# Patient Record
Sex: Male | Born: 1955 | Race: White | Hispanic: No | Marital: Married | Smoking: Never smoker
Health system: Southern US, Community
[De-identification: ages and names within clinical notes are randomized; demographics above are authoritative.]

## PROBLEM LIST (undated history)

## (undated) DIAGNOSIS — I10 Essential (primary) hypertension: Secondary | ICD-10-CM

## (undated) DIAGNOSIS — E119 Type 2 diabetes mellitus without complications: Secondary | ICD-10-CM

## (undated) HISTORY — PX: OTHER SURGICAL HISTORY: SHX169

## (undated) HISTORY — PX: HERNIA REPAIR: SHX51

---

## 2013-06-18 ENCOUNTER — Encounter (HOSPITAL_BASED_OUTPATIENT_CLINIC_OR_DEPARTMENT_OTHER): Payer: Self-pay | Admitting: Emergency Medicine

## 2013-06-18 ENCOUNTER — Emergency Department (HOSPITAL_BASED_OUTPATIENT_CLINIC_OR_DEPARTMENT_OTHER)
Admission: EM | Admit: 2013-06-18 | Discharge: 2013-06-18 | Disposition: A | Discharge status: Home / Self Care | Payer: BC Managed Care – PPO | Attending: Emergency Medicine | Admitting: Emergency Medicine

## 2013-06-18 ENCOUNTER — Emergency Department (HOSPITAL_BASED_OUTPATIENT_CLINIC_OR_DEPARTMENT_OTHER): Payer: BC Managed Care – PPO

## 2013-06-18 DIAGNOSIS — W010XXA Fall on same level from slipping, tripping and stumbling without subsequent striking against object, initial encounter: Secondary | ICD-10-CM | POA: Insufficient documentation

## 2013-06-18 DIAGNOSIS — Z79899 Other long term (current) drug therapy: Secondary | ICD-10-CM | POA: Insufficient documentation

## 2013-06-18 DIAGNOSIS — W1809XA Striking against other object with subsequent fall, initial encounter: Secondary | ICD-10-CM | POA: Insufficient documentation

## 2013-06-18 DIAGNOSIS — Y9319 Activity, other involving water and watercraft: Secondary | ICD-10-CM | POA: Insufficient documentation

## 2013-06-18 DIAGNOSIS — E119 Type 2 diabetes mellitus without complications: Secondary | ICD-10-CM | POA: Insufficient documentation

## 2013-06-18 DIAGNOSIS — Y9289 Other specified places as the place of occurrence of the external cause: Secondary | ICD-10-CM | POA: Insufficient documentation

## 2013-06-18 DIAGNOSIS — X500XXA Overexertion from strenuous movement or load, initial encounter: Secondary | ICD-10-CM | POA: Insufficient documentation

## 2013-06-18 DIAGNOSIS — S93401A Sprain of unspecified ligament of right ankle, initial encounter: Secondary | ICD-10-CM

## 2013-06-18 DIAGNOSIS — I10 Essential (primary) hypertension: Secondary | ICD-10-CM | POA: Insufficient documentation

## 2013-06-18 DIAGNOSIS — S93409A Sprain of unspecified ligament of unspecified ankle, initial encounter: Secondary | ICD-10-CM | POA: Insufficient documentation

## 2013-06-18 DIAGNOSIS — L03119 Cellulitis of unspecified part of limb: Secondary | ICD-10-CM

## 2013-06-18 DIAGNOSIS — Z9889 Other specified postprocedural states: Secondary | ICD-10-CM | POA: Insufficient documentation

## 2013-06-18 HISTORY — DX: Type 2 diabetes mellitus without complications: E11.9

## 2013-06-18 HISTORY — DX: Essential (primary) hypertension: I10

## 2013-06-18 LAB — CBC WITH DIFFERENTIAL/PLATELET
BASOS PCT: 0 % (ref 0–1)
Basophils Absolute: 0 10*3/uL (ref 0.0–0.1)
EOS ABS: 0.4 10*3/uL (ref 0.0–0.7)
EOS PCT: 3 % (ref 0–5)
HEMATOCRIT: 41.7 % (ref 39.0–52.0)
HEMOGLOBIN: 14.4 g/dL (ref 13.0–17.0)
Lymphocytes Relative: 16 % (ref 12–46)
Lymphs Abs: 2.3 10*3/uL (ref 0.7–4.0)
MCH: 30.2 pg (ref 26.0–34.0)
MCHC: 34.5 g/dL (ref 30.0–36.0)
MCV: 87.4 fL (ref 78.0–100.0)
MONO ABS: 1.6 10*3/uL — AB (ref 0.1–1.0)
Monocytes Relative: 11 % (ref 3–12)
Neutro Abs: 9.8 10*3/uL — ABNORMAL HIGH (ref 1.7–7.7)
Neutrophils Relative %: 70 % (ref 43–77)
Platelets: 271 10*3/uL (ref 150–400)
RBC: 4.77 MIL/uL (ref 4.22–5.81)
RDW: 13.4 % (ref 11.5–15.5)
WBC: 14.1 10*3/uL — ABNORMAL HIGH (ref 4.0–10.5)

## 2013-06-18 LAB — CBG MONITORING, ED: Glucose-Capillary: 306 mg/dL — ABNORMAL HIGH (ref 70–99)

## 2013-06-18 MED ORDER — CEPHALEXIN 500 MG PO CAPS: 500.0000 mg | ORAL_CAPSULE | Freq: Four times a day (QID) | ORAL | Status: DC

## 2013-06-18 NOTE — Discharge Instructions (Signed)
Ankle Sprain °An ankle sprain is an injury to the strong, fibrous tissues (ligaments) that hold the bones of your ankle joint together.  °CAUSES °An ankle sprain is usually caused by a fall or by twisting your ankle. Ankle sprains most commonly occur when you step on the outer edge of your foot, and your ankle turns inward. People who participate in sports are more prone to these types of injuries.  °SYMPTOMS  °· Pain in your ankle. The pain may be present at rest or only when you are trying to stand or walk. °· Swelling. °· Bruising. Bruising may develop immediately or within 1 to 2 days after your injury. °· Difficulty standing or walking, particularly when turning corners or changing directions. °DIAGNOSIS  °Your caregiver will ask you details about your injury and perform a physical exam of your ankle to determine if you have an ankle sprain. During the physical exam, your caregiver will press on and apply pressure to specific areas of your foot and ankle. Your caregiver will try to move your ankle in certain ways. An X-ray exam may be done to be sure a bone was not broken or a ligament did not separate from one of the bones in your ankle (avulsion fracture).  °TREATMENT  °Certain types of braces can help stabilize your ankle. Your caregiver can make a recommendation for this. Your caregiver may recommend the use of medicine for pain. If your sprain is severe, your caregiver may refer you to a surgeon who helps to restore function to parts of your skeletal system (orthopedist) or a physical therapist. °HOME CARE INSTRUCTIONS  °· Apply ice to your injury for 1 2 days or as directed by your caregiver. Applying ice helps to reduce inflammation and pain. °· Put ice in a plastic bag. °· Place a towel between your skin and the bag. °· Leave the ice on for 15-20 minutes at a time, every 2 hours while you are awake. °· Only take over-the-counter or prescription medicines for pain, discomfort, or fever as directed by  your caregiver. °· Elevate your injured ankle above the level of your heart as much as possible for 2 3 days. °· If your caregiver recommends crutches, use them as instructed. Gradually put weight on the affected ankle. Continue to use crutches or a cane until you can walk without feeling pain in your ankle. °· If you have a plaster splint, wear the splint as directed by your caregiver. Do not rest it on anything harder than a pillow for the first 24 hours. Do not put weight on it. Do not get it wet. You may take it off to take a shower or bath. °· You may have been given an elastic bandage to wear around your ankle to provide support. If the elastic bandage is too tight (you have numbness or tingling in your foot or your foot becomes cold and blue), adjust the bandage to make it comfortable. °· If you have an air splint, you may blow more air into it or let air out to make it more comfortable. You may take your splint off at night and before taking a shower or bath. Wiggle your toes in the splint several times per day to decrease swelling. °SEEK MEDICAL CARE IF:  °· You have rapidly increasing bruising or swelling. °· Your toes feel extremely cold or you lose feeling in your foot. °· Your pain is not relieved with medicine. °SEEK IMMEDIATE MEDICAL CARE IF: °· Your toes are numb   or blue.  You have severe pain that is increasing. MAKE SURE YOU:   Understand these instructions.  Will watch your condition.  Will get help right away if you are not doing well or get worse. Document Released: 04/01/2005 Document Revised: 12/25/2011 Document Reviewed: 04/13/2011 [facility] Patient Information 2014 [place]Care, [place].  Cellulitis Cellulitis is an infection of the skin and the tissue beneath it. The infected area is usually red and tender. Cellulitis occurs most often in the arms and lower legs.  CAUSES  Cellulitis is caused by bacteria that enter the skin through cracks or cuts in the skin. The most common types  of bacteria that cause cellulitis are Staphylococcus and Streptococcus. SYMPTOMS   Redness and warmth.  Swelling.  Tenderness or pain.  Fever. DIAGNOSIS  Your caregiver can usually determine what is wrong based on a physical exam. Blood tests may also be done. TREATMENT  Treatment usually involves taking an antibiotic medicine. HOME CARE INSTRUCTIONS   Take your antibiotics as directed. Finish them even if you start to feel better.  Keep the infected arm or leg elevated to reduce swelling.  Apply a warm cloth to the affected area up to 4 times per day to relieve pain.  Only take over-the-counter or prescription medicines for pain, discomfort, or fever as directed by your caregiver.  Keep all follow-up appointments as directed by your caregiver. SEEK MEDICAL CARE IF:   You notice red streaks coming from the infected area.  Your red area gets larger or turns dark in color.  Your bone or joint underneath the infected area becomes painful after the skin has healed.  Your infection returns in the same area or another area.  You notice a swollen bump in the infected area.  You develop new symptoms. SEEK IMMEDIATE MEDICAL CARE IF:   You have a fever.  You feel very sleepy.  You develop vomiting or diarrhea.  You have a general ill feeling (malaise) with muscle aches and pains. MAKE SURE YOU:   Understand these instructions.  Will watch your condition.  Will get help right away if you are not doing well or get worse. Document Released: 01/09/2005 Document Revised: 10/01/2011 Document Reviewed: 06/17/2011 [facility] CenterExitCare Patient Information 2014 [place]Care, [place].

## 2013-06-18 NOTE — ED Notes (Signed)
Slipped on ice 3/1-pain to right anterior tib/fib-steady gait into triage

## 2013-06-18 NOTE — ED Provider Notes (Signed)
CSN: [id number]     Arrival date & time 06/18/13  1942 History   First MD Initiated Contact with Patient 06/18/13 2011     Chief Complaint  Patient presents with  . Leg Injury     (Consider location/radiation/quality/duration/timing/severity/associated sxs/prior Treatment) HPI Comments: Patient here with right lower extremity pain - he states that about 5 days ago he slipped on the ice and twisted his right ankle and then hitting his right anterior shin on the steps - has been ambulatory since the event.  He reports redness and edema to the lower extremity as well.  He states that he also began to notice bruising to his right ankle yesterday as well after being on his foot from the area.  He is diabetic and states that his blood sugars have been normal lately.  The history is provided by the patient. No language interpreter was used.    Past Medical History  Diagnosis Date  . Diabetes mellitus without complication   . Hypertension    Past Surgical History  Procedure Laterality Date  . Hernia repair    . Foot surgery     No family history on file. History  Substance Use Topics  . Smoking status: Never Smoker   . Smokeless tobacco: Not on file  . Alcohol Use: No    Review of Systems  Constitutional: Negative for fever.  Respiratory: Negative for chest tightness and shortness of breath.   Cardiovascular: Negative for chest pain.  Genitourinary: Negative for dysuria.  Musculoskeletal: Positive for arthralgias.  Skin: Positive for color change.  Neurological: Negative for headaches.  All other systems reviewed and are negative.      Allergies  Codeine  Home Medications   Current Outpatient Rx  Name  Route  Sig  Dispense  Refill  . cephALEXin (KEFLEX) 500 MG capsule   Oral   Take 1 capsule (500 mg total) by mouth 4 (four) times daily.   20 capsule   0   . METFORMIN HCL PO   Oral   Take by mouth.         . Valsartan (DIOVAN PO)   Oral   Take by mouth.           BP 185/100  Pulse 122  Temp(Src) 98.7 F (37.1 C) (Oral)  Resp 20  Ht 6\' 4"  (1.93 m)  Wt 340 lb (154.223 kg)  BMI 41.40 kg/m2 Physical Exam  Nursing note and vitals reviewed. Constitutional: He is oriented to person, place, and time. He appears well-developed and well-nourished. No distress.  HENT:  Head: Normocephalic and atraumatic.  Mouth/Throat: Oropharynx is clear and moist.  Eyes: Conjunctivae are normal. No scleral icterus.  Pulmonary/Chest: Effort normal.  Musculoskeletal: He exhibits edema and tenderness.       Right ankle: He exhibits swelling. He exhibits normal range of motion, no ecchymosis, no laceration and normal pulse. Tenderness. Lateral malleolus and medial malleolus tenderness found. Achilles tendon normal.  Neurological: He is alert and oriented to person, place, and time. He exhibits normal muscle tone. Coordination normal.  Skin: Skin is warm and dry. No rash noted. There is erythema. No pallor.  Right lower extremity with increased warmth and redness, mild edema noted as well, no calf tenderness to palpation, no cording, negative Homan's sign.  Psychiatric: He has a normal mood and affect. His behavior is normal. Judgment and thought content normal.    ED Course  Procedures (including critical care time) Labs Review Labs Reviewed  CBC  WITH DIFFERENTIAL - Abnormal; Notable for the following:    WBC 14.1 (*)    Neutro Abs 9.8 (*)    Monocytes Absolute 1.6 (*)    All other components within normal limits  CBG MONITORING, ED - Abnormal; Notable for the following:    Glucose-Capillary 306 (*)    All other components within normal limits   Imaging Review Dg Tibia/fibula Right  06/18/2013   CLINICAL DATA:  History of trauma from a fall. Pain in the anterior right tibia and fibula.  EXAM: RIGHT TIBIA AND FIBULA - 2 VIEW  COMPARISON:  No priors.  FINDINGS: AP and lateral views of the right leg demonstrate no acute displaced fracture of the tibia or fibula.  Soft tissues are unremarkable. Degenerative changes of osteoarthritis are noted in the knee joint and the tibiotalar joint.  IMPRESSION: 1. No acute radiographic abnormality of the right tibia or fibula.   Electronically Signed   By: Trudie Reed M.D.   On: 06/18/2013 21:28   Dg Ankle Complete Right  06/18/2013   CLINICAL DATA:  History of trauma from a fall.  Right ankle pain.  EXAM: RIGHT ANKLE - COMPLETE 3+ VIEW  COMPARISON:  No priors.  FINDINGS: A tiny well corticated bony fragment is seen adjacent to the tip of the lateral malleolus, likely sequela of remote avulsion fracture. No acute displaced fracture, subluxation or dislocation is noted. Small plantar and dorsal calcaneal enthesophytes are noted. Degenerative changes of osteoarthritis are noted at the tibiotalar joint and throughout the midfoot.  IMPRESSION: 1. No acute radiographic abnormality of the right ankle.   Electronically Signed   By: Trudie Reed M.D.   On: 06/18/2013 21:29     EKG Interpretation None      MDM   Final diagnoses:  Right ankle sprain  Cellulitis of lower leg    Patient with history of DM presents to the ED after injuring his right ankle and striking his right shin on the cement steps, his x-ray is negative for fracture so he likely has a sprain.  His blood sugar is elevated at 308 but he states that he just ate a pizza before coming to the hospital, though part of the elevation may be more related to the cellullitis.  There is a slight leukocytosis but does not meet SIRS criteria.  He was noted to be tachycardic but his repeat heart rate was 98.  I doubt DVT at this time as the patient has no erythema posteriorly, no pain to his calf and negative Homan's sign.      Izola Price Marisue Humble, [place] 06/18/13 2205

## 2013-06-19 NOTE — ED Provider Notes (Signed)
Medical screening examination/treatment/procedure(s) were performed by non-physician practitioner and as supervising physician I was immediately available for consultation/collaboration.   EKG Interpretation None        Reece Fehnel David Arlo Butt III, MD 06/19/13 1648 

## 2013-06-29 ENCOUNTER — Ambulatory Visit: Payer: Self-pay

## 2013-09-09 ENCOUNTER — Encounter (HOSPITAL_BASED_OUTPATIENT_CLINIC_OR_DEPARTMENT_OTHER): Payer: Self-pay | Admitting: Emergency Medicine

## 2013-09-09 ENCOUNTER — Emergency Department (HOSPITAL_BASED_OUTPATIENT_CLINIC_OR_DEPARTMENT_OTHER)
Admission: EM | Admit: 2013-09-09 | Discharge: 2013-09-09 | Disposition: A | Discharge status: Home / Self Care | Payer: BC Managed Care – PPO | Attending: Emergency Medicine | Admitting: Emergency Medicine

## 2013-09-09 DIAGNOSIS — I10 Essential (primary) hypertension: Secondary | ICD-10-CM | POA: Insufficient documentation

## 2013-09-09 DIAGNOSIS — M5430 Sciatica, unspecified side: Secondary | ICD-10-CM

## 2013-09-09 DIAGNOSIS — Z79899 Other long term (current) drug therapy: Secondary | ICD-10-CM | POA: Insufficient documentation

## 2013-09-09 DIAGNOSIS — E119 Type 2 diabetes mellitus without complications: Secondary | ICD-10-CM | POA: Insufficient documentation

## 2013-09-09 MED ORDER — CYCLOBENZAPRINE HCL 5 MG PO TABS: 5.0000 mg | ORAL_TABLET | Freq: Two times a day (BID) | ORAL | Status: AC | PRN

## 2013-09-09 MED ORDER — KETOROLAC TROMETHAMINE 30 MG/ML IJ SOLN
60.0000 mg | Freq: Once | INTRAMUSCULAR | Status: AC
  Administered 2013-09-09: 60 mg via INTRAMUSCULAR
  Filled 2013-09-09: qty 2

## 2013-09-09 MED ORDER — OXYCODONE-ACETAMINOPHEN 5-325 MG PO TABS
2.0000 | ORAL_TABLET | Freq: Once | ORAL | Status: AC
  Administered 2013-09-09: 2 via ORAL
  Filled 2013-09-09: qty 2

## 2013-09-09 MED ORDER — OXYCODONE-ACETAMINOPHEN 5-325 MG PO TABS: 1.0000 | ORAL_TABLET | Freq: Three times a day (TID) | ORAL | Status: AC | PRN

## 2013-09-09 MED ORDER — CYCLOBENZAPRINE HCL 10 MG PO TABS
5.0000 mg | ORAL_TABLET | Freq: Once | ORAL | Status: AC
  Administered 2013-09-09: 5 mg via ORAL
  Filled 2013-09-09: qty 1

## 2013-09-09 NOTE — ED Provider Notes (Signed)
CSN: [id number]     Arrival date & time 09/09/13  1932 History   First MD Initiated Contact with Patient 09/09/13 2027     Chief Complaint  Patient presents with  . Back Pain     (Consider location/radiation/quality/duration/timing/severity/associated sxs/prior Treatment) HPI Comments: Pt states that he has a history of back pain intermittently. Pt states that this pain started 3 days ago and it is radiating down his right leg. Denies numbness or weakness. Tried otc medication at home without relief. No falls. States that it started after some heavy lifting  The history is provided by the patient. No language interpreter was used.    Past Medical History  Diagnosis Date  . Diabetes mellitus without complication   . Hypertension    Past Surgical History  Procedure Laterality Date  . Hernia repair    . Foot surgery     History reviewed. No pertinent family history. History  Substance Use Topics  . Smoking status: Never Smoker   . Smokeless tobacco: Not on file  . Alcohol Use: No    Review of Systems  Constitutional: Negative.   Respiratory: Negative.       Allergies  Codeine  Home Medications   Prior to Admission medications   Medication Sig Start Date End Date Taking? Authorizing Provider  METFORMIN HCL PO Take by mouth.    Historical Provider, MD  Valsartan (DIOVAN PO) Take by mouth.    Historical Provider, MD   BP 140/104  Pulse 104  Temp(Src) 97.6 F (36.4 C)  Resp 16  Ht 6\' 4"  (1.93 m)  Wt 345 lb (156.491 kg)  BMI 42.01 kg/m2  SpO2 99% Physical Exam  Nursing note and vitals reviewed. Constitutional: He is oriented to person, place, and time. He appears well-developed and well-nourished.  Cardiovascular: Normal rate and regular rhythm.   Pulmonary/Chest: Effort normal and breath sounds normal.  Musculoskeletal:  Left sciatic notch tenderness. Moving both legs without any problem  Neurological: He is alert and oriented to person, place, and time. He  exhibits normal muscle tone. Coordination normal.  Skin: Skin is warm.    ED Course  Procedures (including critical care time) Labs Review Labs Reviewed - No data to display  Imaging Review No results found.   EKG Interpretation None      MDM   Final diagnoses:  Sciatica    Pt is neurologically intact and EQ:ASTM treat symptomatically and give follow up as needed   Teressa Lower, NP 09/13/13 [phone]

## 2013-09-09 NOTE — ED Notes (Signed)
Pt c/o back pain which radiates down left hip and leg x 3 days hx of same

## 2013-09-09 NOTE — Discharge Instructions (Signed)
Sciatica °Sciatica is pain, weakness, numbness, or tingling along the path of the sciatic nerve. The nerve starts in the lower back and runs down the back of each leg. The nerve controls the muscles in the lower leg and in the back of the knee, while also providing sensation to the back of the thigh, lower leg, and the sole of your foot. Sciatica is a symptom of another medical condition. For instance, nerve damage or certain conditions, such as a herniated disk or bone spur on the spine, pinch or put pressure on the sciatic nerve. This causes the pain, weakness, or other sensations normally associated with sciatica. Generally, sciatica only affects one side of the body. °CAUSES  °· Herniated or slipped disc. °· Degenerative disk disease. °· A pain disorder involving the narrow muscle in the buttocks (piriformis syndrome). °· Pelvic injury or fracture. °· Pregnancy. °· Tumor (rare). °SYMPTOMS  °Symptoms can vary from mild to very severe. The symptoms usually travel from the low back to the buttocks and down the back of the leg. Symptoms can include: °· Mild tingling or dull aches in the lower back, leg, or hip. °· Numbness in the back of the calf or sole of the foot. °· Burning sensations in the lower back, leg, or hip. °· Sharp pains in the lower back, leg, or hip. °· Leg weakness. °· Severe back pain inhibiting movement. °These symptoms may get worse with coughing, sneezing, laughing, or prolonged sitting or standing. Also, being overweight may worsen symptoms. °DIAGNOSIS  °Your caregiver will perform a physical exam to look for common symptoms of sciatica. He or she may ask you to do certain movements or activities that would trigger sciatic nerve pain. Other tests may be performed to find the cause of the sciatica. These may include: °· Blood tests. °· X-rays. °· Imaging tests, such as an MRI or CT scan. °TREATMENT  °Treatment is directed at the cause of the sciatic pain. Sometimes, treatment is not necessary  and the pain and discomfort goes away on its own. If treatment is needed, your caregiver may suggest: °· Over-the-counter medicines to relieve pain. °· Prescription medicines, such as anti-inflammatory medicine, muscle relaxants, or narcotics. °· Applying heat or ice to the painful area. °· Steroid injections to lessen pain, irritation, and inflammation around the nerve. °· Reducing activity during periods of pain. °· Exercising and stretching to strengthen your abdomen and improve flexibility of your spine. Your caregiver may suggest losing weight if the extra weight makes the back pain worse. °· Physical therapy. °· Surgery to eliminate what is pressing or pinching the nerve, such as a bone spur or part of a herniated disk. °HOME CARE INSTRUCTIONS  °· Only take over-the-counter or prescription medicines for pain or discomfort as directed by your caregiver. °· Apply ice to the affected area for 20 minutes, 3 4 times a day for the first 48 72 hours. Then try heat in the same way. °· Exercise, stretch, or perform your usual activities if these do not aggravate your pain. °· Attend physical therapy sessions as directed by your caregiver. °· Keep all follow-up appointments as directed by your caregiver. °· Do not wear high heels or shoes that do not provide proper support. °· Check your mattress to see if it is too soft. A firm mattress may lessen your pain and discomfort. °SEEK IMMEDIATE MEDICAL CARE IF:  °· You lose control of your bowel or bladder (incontinence). °· You have increasing weakness in the lower back,   pelvis, buttocks, or legs. °· You have redness or swelling of your back. °· You have a burning sensation when you urinate. °· You have pain that gets worse when you lie down or awakens you at night. °· Your pain is worse than you have experienced in the past. °· Your pain is lasting longer than 4 weeks. °· You are suddenly losing weight without reason. °MAKE SURE YOU: °· Understand these  instructions. °· Will watch your condition. °· Will get help right away if you are not doing well or get worse. °Document Released: 03/26/2001 Document Revised: 10/01/2011 Document Reviewed: 08/11/2011 °Exit[employer]® Patient Information ©2014 [employer]. ° °

## 2013-09-13 ENCOUNTER — Ambulatory Visit: Payer: BC Managed Care – PPO | Admitting: Family Medicine

## 2013-09-15 ENCOUNTER — Other Ambulatory Visit: Payer: Self-pay | Admitting: Sports Medicine

## 2013-09-15 DIAGNOSIS — M545 Low back pain, unspecified: Secondary | ICD-10-CM

## 2013-09-15 DIAGNOSIS — M5430 Sciatica, unspecified side: Secondary | ICD-10-CM

## 2013-09-17 NOTE — ED Provider Notes (Signed)
Medical screening examination/treatment/procedure(s) were performed by non-physician practitioner and as supervising physician I was immediately available for consultation/collaboration.   EKG Interpretation None        Shanna Cisco, MD 09/17/13 1542

## 2013-09-21 ENCOUNTER — Other Ambulatory Visit: Payer: Self-pay | Admitting: Sports Medicine

## 2013-09-21 DIAGNOSIS — Z139 Encounter for screening, unspecified: Secondary | ICD-10-CM

## 2013-09-22 ENCOUNTER — Ambulatory Visit
Admission: RE | Admit: 2013-09-22 | Discharge: 2013-09-22 | Disposition: A | Discharge status: Home / Self Care | Payer: BC Managed Care – PPO | Source: Ambulatory Visit | Attending: Sports Medicine | Admitting: Sports Medicine

## 2013-09-22 DIAGNOSIS — M5430 Sciatica, unspecified side: Secondary | ICD-10-CM

## 2013-09-22 DIAGNOSIS — M545 Low back pain, unspecified: Secondary | ICD-10-CM

## 2013-09-22 DIAGNOSIS — Z139 Encounter for screening, unspecified: Secondary | ICD-10-CM

## 2013-10-04 ENCOUNTER — Ambulatory Visit: Payer: BC Managed Care – PPO | Attending: Sports Medicine | Admitting: Physical Therapy

## 2013-10-04 DIAGNOSIS — IMO0001 Reserved for inherently not codable concepts without codable children: Secondary | ICD-10-CM | POA: Insufficient documentation

## 2013-10-04 DIAGNOSIS — M545 Low back pain, unspecified: Secondary | ICD-10-CM | POA: Insufficient documentation

## 2013-10-07 ENCOUNTER — Ambulatory Visit: Payer: BC Managed Care – PPO | Admitting: Physical Therapy

## 2013-10-12 ENCOUNTER — Ambulatory Visit: Payer: BC Managed Care – PPO | Admitting: Physical Therapy

## 2013-10-14 ENCOUNTER — Ambulatory Visit: Payer: BC Managed Care – PPO | Attending: Sports Medicine | Admitting: Physical Therapy

## 2013-10-14 DIAGNOSIS — M545 Low back pain, unspecified: Secondary | ICD-10-CM | POA: Insufficient documentation

## 2013-10-14 DIAGNOSIS — IMO0001 Reserved for inherently not codable concepts without codable children: Secondary | ICD-10-CM | POA: Insufficient documentation

## 2013-10-18 ENCOUNTER — Ambulatory Visit: Payer: BC Managed Care – PPO | Admitting: Physical Therapy

## 2013-10-21 ENCOUNTER — Ambulatory Visit: Payer: BC Managed Care – PPO | Admitting: Physical Therapy

## 2013-10-26 ENCOUNTER — Ambulatory Visit: Payer: BC Managed Care – PPO | Admitting: Physical Therapy

## 2013-10-28 ENCOUNTER — Ambulatory Visit: Payer: BC Managed Care – PPO | Admitting: Physical Therapy

## 2013-11-01 ENCOUNTER — Ambulatory Visit: Payer: BC Managed Care – PPO | Admitting: Physical Therapy

## 2013-11-03 ENCOUNTER — Ambulatory Visit: Payer: BC Managed Care – PPO | Admitting: Physical Therapy

## 2013-11-17 ENCOUNTER — Ambulatory Visit: Payer: BC Managed Care – PPO | Admitting: Physical Therapy

## 2013-11-19 ENCOUNTER — Ambulatory Visit: Payer: BC Managed Care – PPO | Admitting: Physical Therapy

## 2013-11-23 ENCOUNTER — Ambulatory Visit: Payer: BC Managed Care – PPO | Admitting: Physical Therapy

## 2013-11-25 ENCOUNTER — Ambulatory Visit: Payer: BC Managed Care – PPO | Admitting: Physical Therapy

## 2014-11-14 ENCOUNTER — Emergency Department (HOSPITAL_BASED_OUTPATIENT_CLINIC_OR_DEPARTMENT_OTHER)
Admission: EM | Admit: 2014-11-14 | Discharge: 2014-11-14 | Disposition: A | Discharge status: Home / Self Care | Payer: BLUE CROSS/BLUE SHIELD | Attending: Emergency Medicine | Admitting: Emergency Medicine

## 2014-11-14 ENCOUNTER — Emergency Department (HOSPITAL_BASED_OUTPATIENT_CLINIC_OR_DEPARTMENT_OTHER): Payer: BLUE CROSS/BLUE SHIELD

## 2014-11-14 ENCOUNTER — Encounter (HOSPITAL_BASED_OUTPATIENT_CLINIC_OR_DEPARTMENT_OTHER): Payer: Self-pay | Admitting: *Deleted

## 2014-11-14 DIAGNOSIS — M79604 Pain in right leg: Secondary | ICD-10-CM | POA: Diagnosis present

## 2014-11-14 DIAGNOSIS — E119 Type 2 diabetes mellitus without complications: Secondary | ICD-10-CM | POA: Diagnosis not present

## 2014-11-14 DIAGNOSIS — L03115 Cellulitis of right lower limb: Secondary | ICD-10-CM | POA: Diagnosis not present

## 2014-11-14 DIAGNOSIS — I10 Essential (primary) hypertension: Secondary | ICD-10-CM | POA: Insufficient documentation

## 2014-11-14 MED ORDER — DOXYCYCLINE HYCLATE 100 MG PO TABS
ORAL_TABLET | ORAL | Status: AC
  Filled 2014-11-14: qty 1

## 2014-11-14 MED ORDER — DOXYCYCLINE HYCLATE 100 MG PO CAPS: 100.0000 mg | ORAL_CAPSULE | Freq: Two times a day (BID) | ORAL | Status: AC

## 2014-11-14 MED ORDER — DOXYCYCLINE HYCLATE 100 MG PO TABS
100.0000 mg | ORAL_TABLET | Freq: Two times a day (BID) | ORAL | Status: DC
  Administered 2014-11-14: 100 mg via ORAL

## 2014-11-14 MED ORDER — HYDROCODONE-ACETAMINOPHEN 5-325 MG PO TABS
1.0000 | ORAL_TABLET | Freq: Four times a day (QID) | ORAL | Status: AC | PRN
Start: 2014-11-14 — End: ?

## 2014-11-14 NOTE — ED Provider Notes (Signed)
CSN: [id number]     Arrival date & time 11/14/14  1552 History   First MD Initiated Contact with Patient 11/14/14 [id number]     Chief Complaint  Patient presents with  . Leg Pain     (Consider location/radiation/quality/duration/timing/severity/associated sxs/prior Treatment) HPI Dominic Johnson is a 59 y.o. male with hx of DM, HTN, presents to ED with right leg pain and swelling.  Pt states pain and swelling started yesterday. Hx of injury to that leg and cellulitis. States feels the same. States flew to [place] 4 days ago and thinks he scratched leg while there. States pain is mainly over the scratch. Denies drainage. Denies fever, chills, malaise. Pt flew back from [place] yesterday. States last time he had cellulitis this feels exactly the same. States was given antibiotics and felt much better.   Past Medical History  Diagnosis Date  . Diabetes mellitus without complication   . Hypertension    Past Surgical History  Procedure Laterality Date  . Hernia repair    . Foot surgery     No family history on file. History  Substance Use Topics  . Smoking status: Never Smoker   . Smokeless tobacco: Not on file  . Alcohol Use: No    Review of Systems  Constitutional: Negative for fever and chills.  Cardiovascular: Positive for leg swelling.  Skin: Positive for rash and wound.  All other systems reviewed and are negative.     Allergies  Codeine  Home Medications   Prior to Admission medications   Medication Sig Start Date End Date Taking? Authorizing Provider  cyclobenzaprine (FLEXERIL) 5 MG tablet Take 1 tablet (5 mg total) by mouth 2 (two) times daily as needed for muscle spasms. 09/09/13   Teressa Lower, NP  METFORMIN HCL PO Take by mouth.    Historical Provider, MD  oxyCODONE-acetaminophen (PERCOCET/ROXICET) 5-325 MG per tablet Take 1-2 tablets by mouth every 8 (eight) hours as needed for moderate pain or severe pain. 09/09/13   Teressa Lower, NP  Valsartan (DIOVAN PO) Take  by mouth.    Historical Provider, MD   BP 145/98 mmHg  Pulse 100  Temp(Src) 98.5 F (36.9 C) (Oral)  Resp 18  Ht 6\' 4"  (1.93 m)  Wt 335 lb (151.955 kg)  BMI 40.79 kg/m2  SpO2 97% Physical Exam  Constitutional: He appears well-developed and well-nourished. No distress.  HENT:  Head: Normocephalic and atraumatic.  Eyes: Conjunctivae are normal.  Neck: Neck supple.  Cardiovascular: Normal rate, regular rhythm and normal heart sounds.   Pulmonary/Chest: Effort normal. No respiratory distress. He has no wheezes. He has no rales.  Musculoskeletal: He exhibits edema.  Right lower extremity erythema, warm to the touch. Small abrasion to the lateral distal LE. No drainage. Bilateral 1+ LE edema.   Neurological: He is alert.  Skin: Skin is warm and dry.  Nursing note and vitals reviewed.   ED Course  Procedures (including critical care time) Labs Review Labs Reviewed - No data to display  Imaging Review US Venous Img Lower Unilateral Right  11/14/2014   CLINICAL DATA:  Acute onset of right lower extremity pain after long flight, with erythema and swelling. Initial encounter.  EXAM: RIGHT LOWER EXTREMITY VENOUS DOPPLER ULTRASOUND  TECHNIQUE: Gray-scale sonography with graded compression, as well as color Doppler and duplex ultrasound were performed to evaluate the lower extremity deep venous systems from the level of the common femoral vein and including the common femoral, femoral, profunda femoral, popliteal and calf veins including  the posterior tibial, peroneal and gastrocnemius veins when visible. The superficial great saphenous vein was also interrogated. Spectral Doppler was utilized to evaluate flow at rest and with distal augmentation maneuvers in the common femoral, femoral and popliteal veins.  COMPARISON:  None.  FINDINGS: Contralateral Common Femoral Vein: Respiratory phasicity is normal and symmetric with the symptomatic side. No evidence of thrombus. Normal compressibility.   Common Femoral Vein: No evidence of thrombus. Normal compressibility, respiratory phasicity and response to augmentation.  Saphenofemoral Junction: No evidence of thrombus. Normal compressibility and flow on color Doppler imaging.  Profunda Femoral Vein: No evidence of thrombus. Normal compressibility and flow on color Doppler imaging.  Femoral Vein: No evidence of thrombus. Normal compressibility, respiratory phasicity and response to augmentation.  Popliteal Vein: No evidence of thrombus. Normal compressibility, respiratory phasicity and response to augmentation.  Calf Veins: No evidence of thrombus. Normal compressibility and flow on color Doppler imaging.  Superficial Great Saphenous Vein: No evidence of thrombus. Normal compressibility and flow on color Doppler imaging.  Venous Reflux:  None.  Other Findings: There is diffuse edema and erythema involving the right calf, compatible with cellulitis. A prominent right inguinal node is seen, measuring 1.6 cm in short axis.  IMPRESSION: 1. No evidence of deep venous thrombosis. 2. Diffuse edema and erythema involving the right calf, compatible with cellulitis. Prominent right inguinal node noted.   Electronically Signed   By: Roanna Raider M.D.   On: 11/14/2014 18:16     EKG Interpretation None      MDM   Final diagnoses:  Cellulitis of right lower leg    Patient with right leg swelling, erythema, tenderness. Recent travel. We'll get venous Dopplers to rule out DVT.  6:29 PM Venous Doppler negative. Exam and ultrasound consistent with cellulitis of the leg. Patient is afebrile, nontoxic appearing. No  of systemic symptoms. Will start on doxycycline, Norco for severe pain, follow-up with primary care doctor.   Filed Vitals:   11/14/14 1556  BP: 145/98  Pulse: 100  Temp: 98.5 F (36.9 C)  TempSrc: Oral  Resp: 18  Height:  (1.93 m)  Weight: 335 lb (151.955 kg)  SpO2: 97%     Jaynie Crumble, PA-C 11/14/14 1829  Jerelyn Scott, MD 11/14/14 [phone]

## 2014-11-14 NOTE — Discharge Instructions (Signed)
Keep leg elevated. Take antibiotics as prescribed until all gone. Take pain medications as needed. Please follow-up with your primary care doctor in 2 days for recheck. Please return to emergency department if your symptoms are getting worse.  Cellulitis Cellulitis is an infection of the skin and the tissue beneath it. The infected area is usually red and tender. Cellulitis occurs most often in the arms and lower legs.  CAUSES  Cellulitis is caused by bacteria that enter the skin through cracks or cuts in the skin. The most common types of bacteria that cause cellulitis are staphylococci and streptococci. SIGNS AND SYMPTOMS   Redness and warmth.  Swelling.  Tenderness or pain.  Fever. DIAGNOSIS  Your health care provider can usually determine what is wrong based on a physical exam. Blood tests may also be done. TREATMENT  Treatment usually involves taking an antibiotic medicine. HOME CARE INSTRUCTIONS   Take your antibiotic medicine as directed by your health care provider. Finish the antibiotic even if you start to feel better.  Keep the infected arm or leg elevated to reduce swelling.  Apply a warm cloth to the affected area up to 4 times per day to relieve pain.  Take medicines only as directed by your health care provider.  Keep all follow-up visits as directed by your health care provider. SEEK MEDICAL CARE IF:   You notice red streaks coming from the infected area.  Your red area gets larger or turns dark in color.  Your bone or joint underneath the infected area becomes painful after the skin has healed.  Your infection returns in the same area or another area.  You notice a swollen bump in the infected area.  You develop new symptoms.  You have a fever. SEEK IMMEDIATE MEDICAL CARE IF:   You feel very sleepy.  You develop vomiting or diarrhea.  You have a general ill feeling (malaise) with muscle aches and pains. MAKE SURE YOU:   Understand these  instructions.  Will watch your condition.  Will get help right away if you are not doing well or get worse. Document Released: 01/09/2005 Document Revised: 08/16/2013 Document Reviewed: 06/17/2011 [facility] Patient Information 2015 [place], [place]. This information is not intended to replace advice given to you by your health care provider. Make sure you discuss any questions you have with your health care provider.

## 2014-11-14 NOTE — ED Notes (Signed)
Right lower leg pain. States the last time he had this pain he was started on antibiotics for cellulitis.

## 2015-03-11 IMAGING — CR DG ANKLE COMPLETE 3+V*R*
3 series · 3 of 3 positions shown · non-contrast
Comparison: No priors.

CLINICAL DATA: History of trauma from a fall.  Right ankle pain.

EXAM:
RIGHT ANKLE - COMPLETE 3+ VIEW

[t ankle joint ap right]
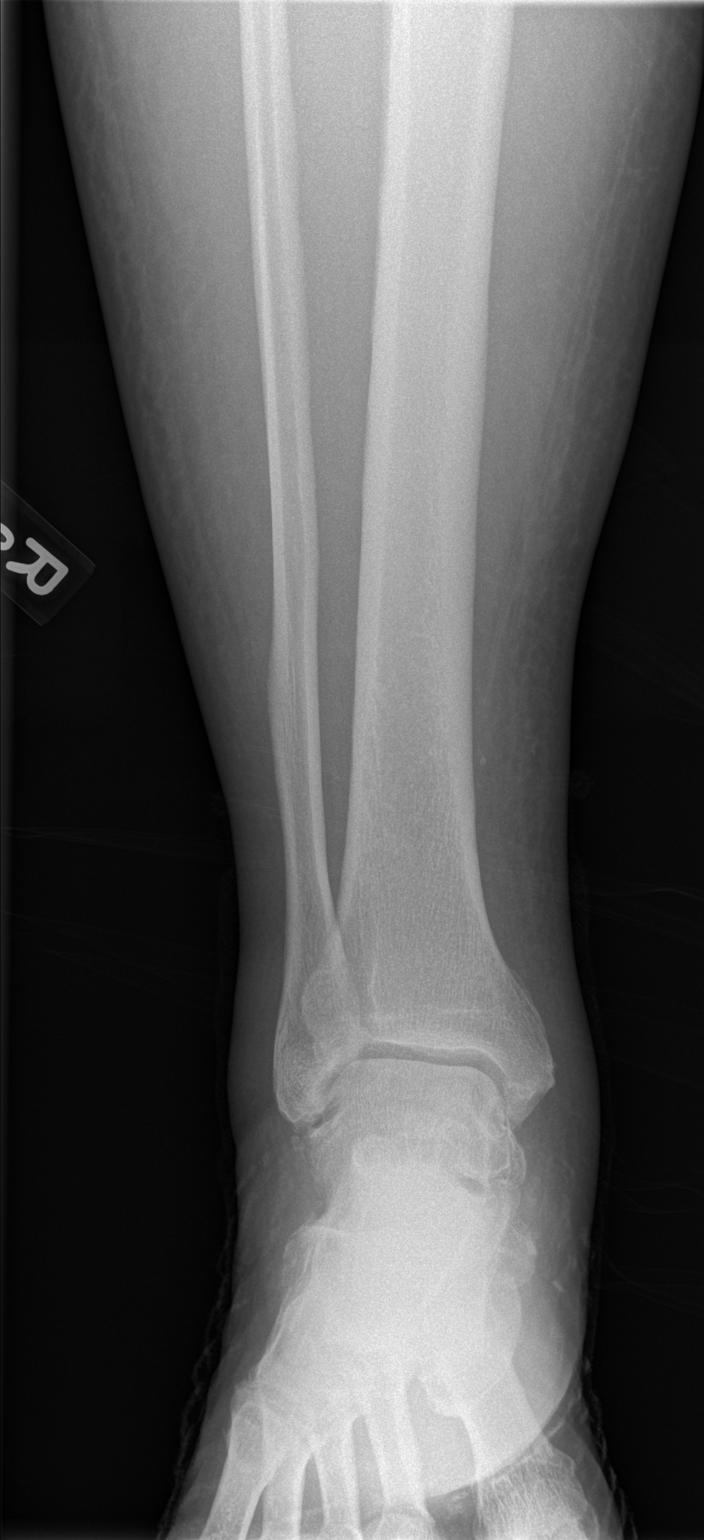

[t ankle joint oblique right]
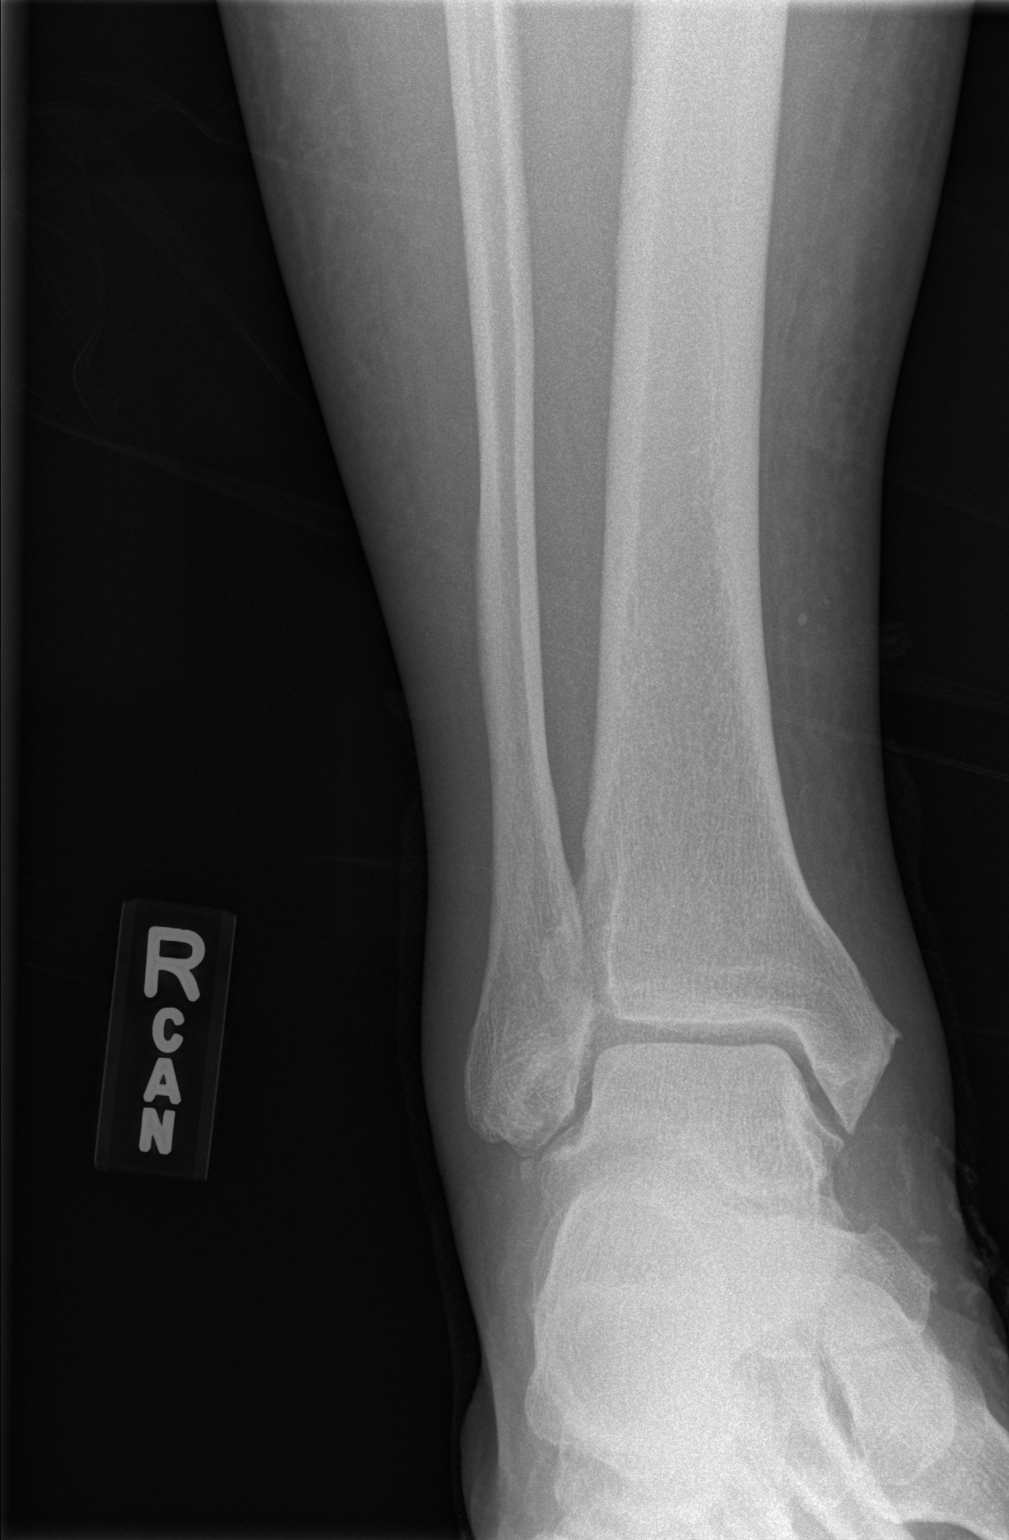

[t ankle joint lat right]
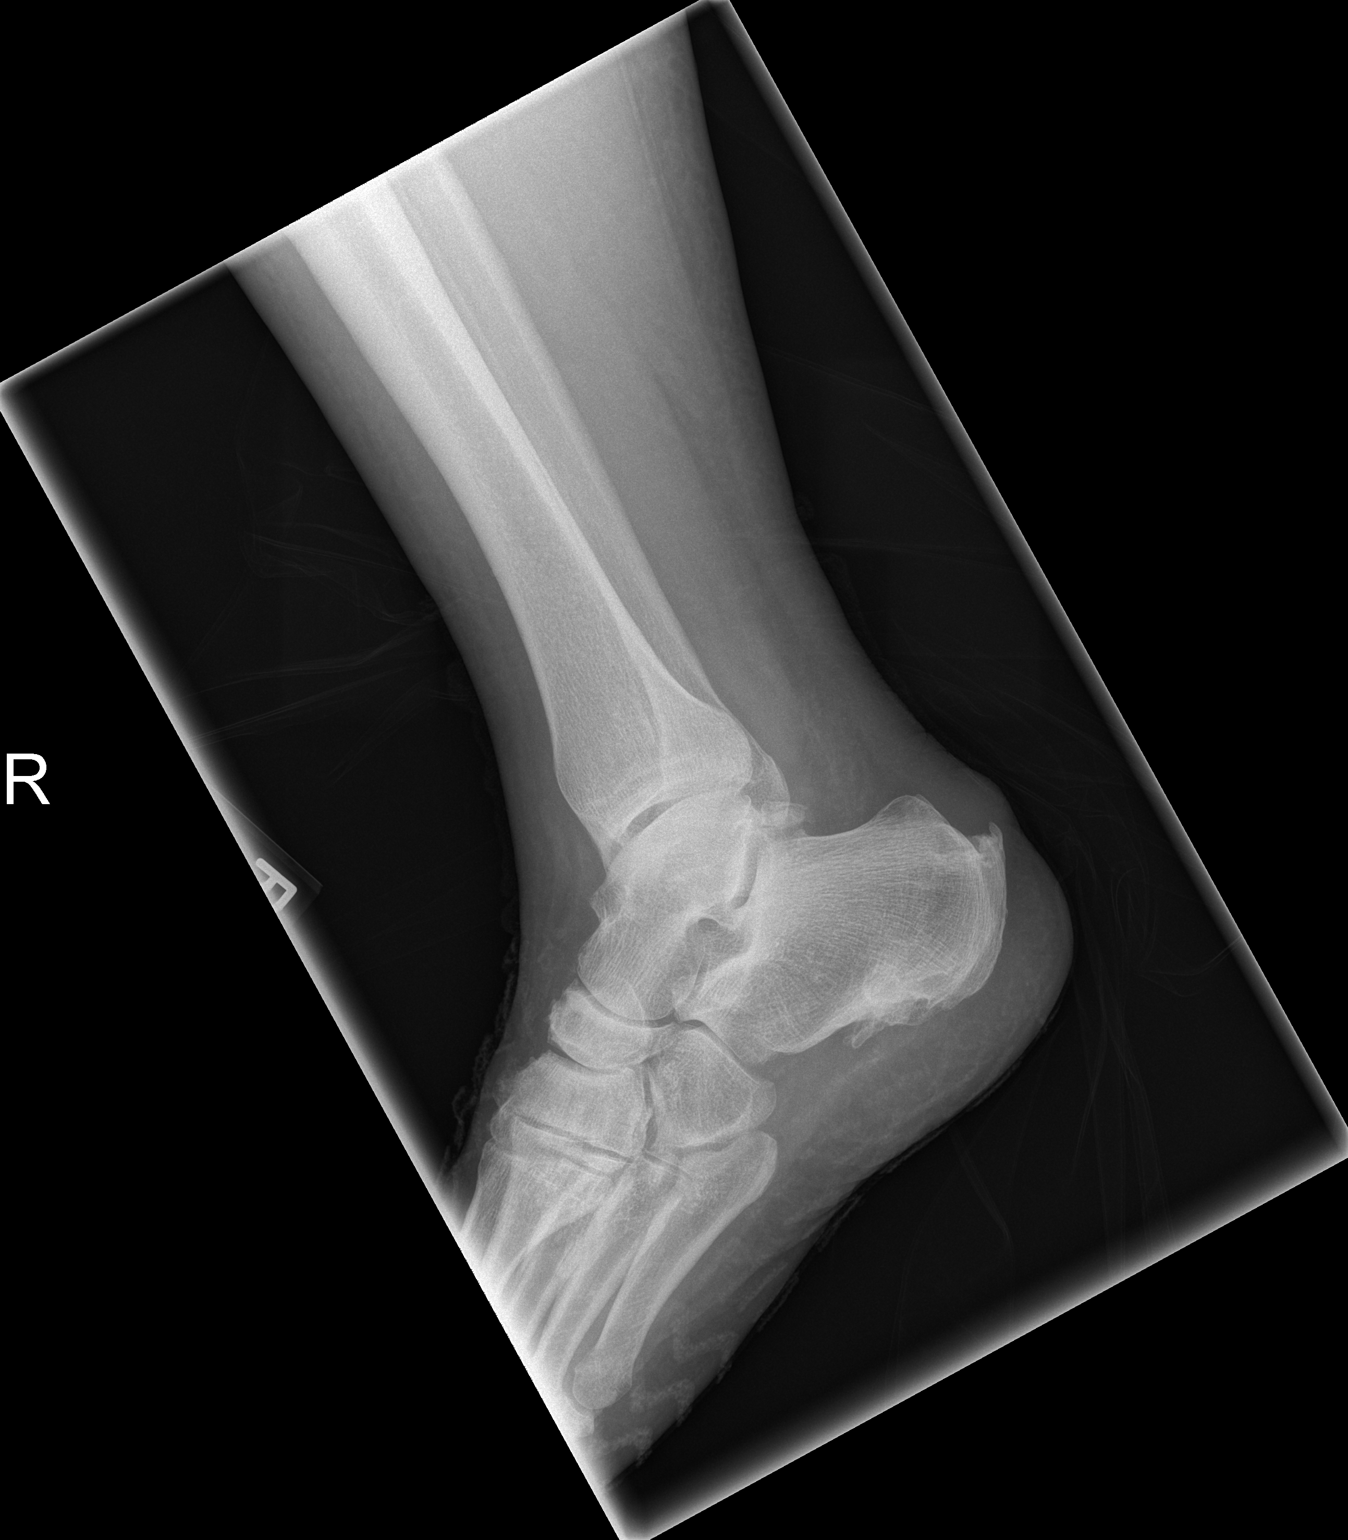

[3 of 3 positions shown; findings below may reference images not displayed]

FINDINGS: A tiny well corticated bony fragment is seen adjacent to the tip of
the lateral malleolus, likely sequela of remote avulsion fracture.
No acute displaced fracture, subluxation or dislocation is noted.
Small plantar and dorsal calcaneal enthesophytes are noted.
Degenerative changes of osteoarthritis are noted at the tibiotalar
joint and throughout the midfoot.
IMPRESSION: 1. No acute radiographic abnormality of the right ankle.

## 2015-03-11 IMAGING — CR DG TIBIA/FIBULA 2V*R*
3 series · 3 of 3 positions shown · non-contrast
Comparison: No priors.

CLINICAL DATA: History of trauma from a fall. Pain in the anterior
right tibia and fibula.

EXAM:
RIGHT TIBIA AND FIBULA - 2 VIEW

[t tib/fib ap right]
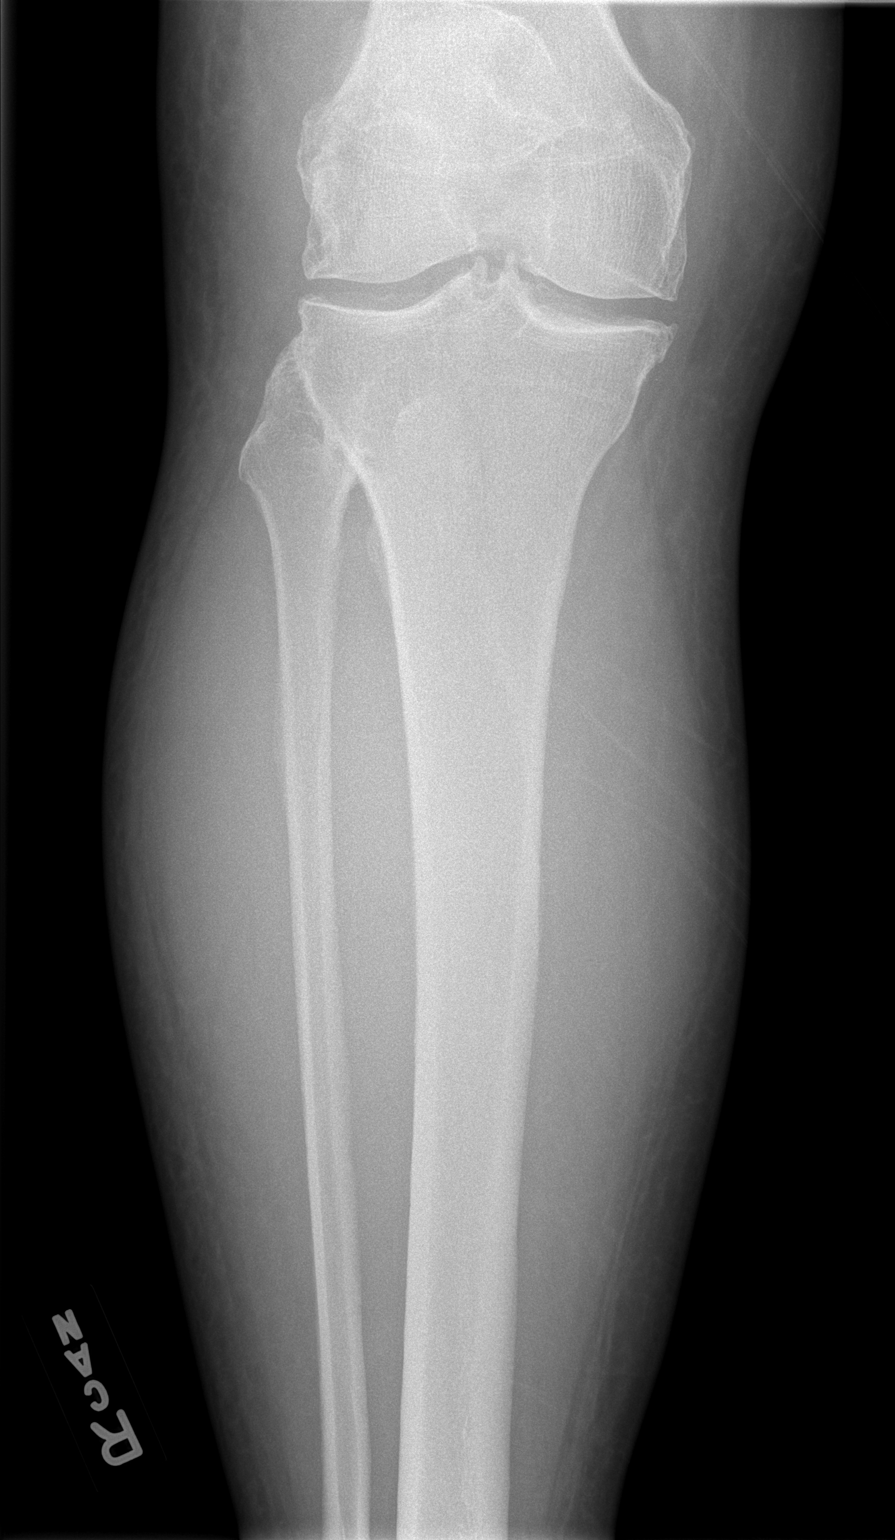

[t tib/fib lat right (1 of 2)]
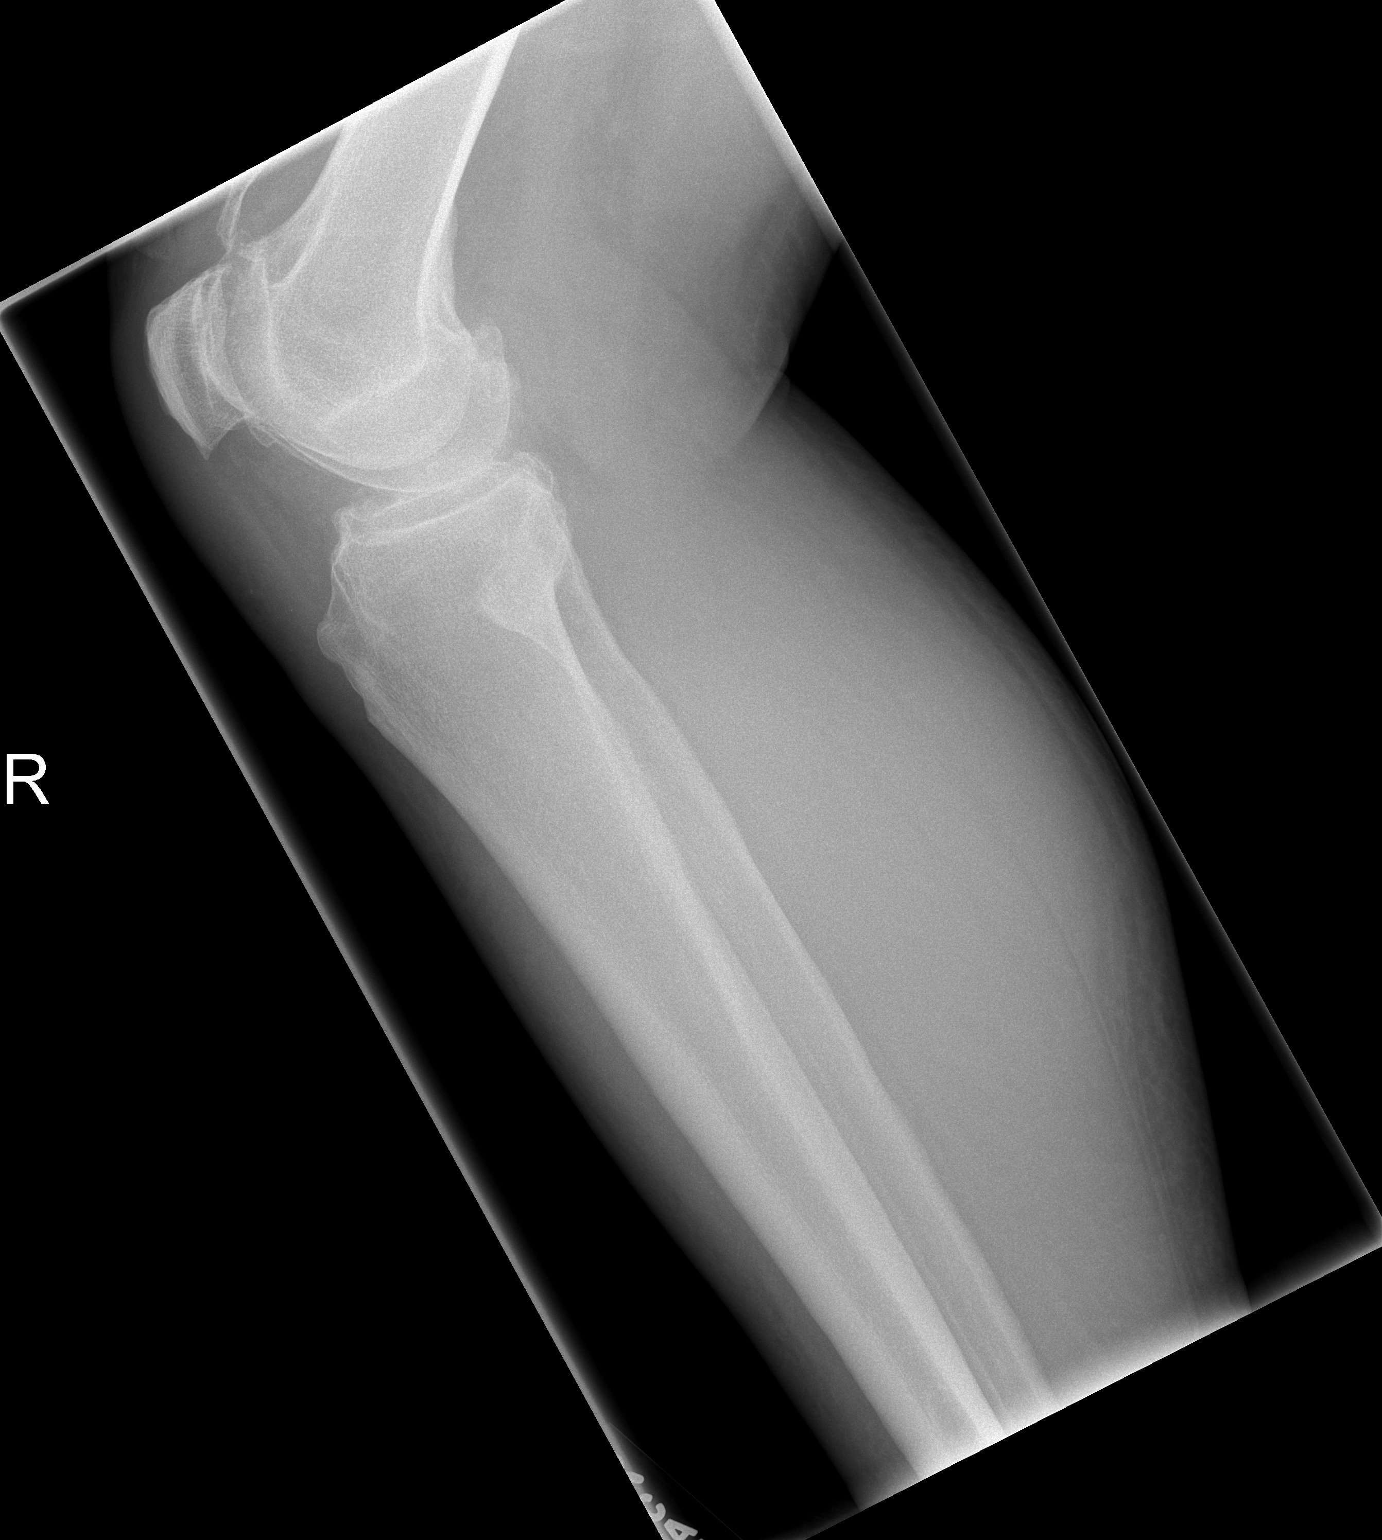

[t tib/fib lat right (2 of 2)]
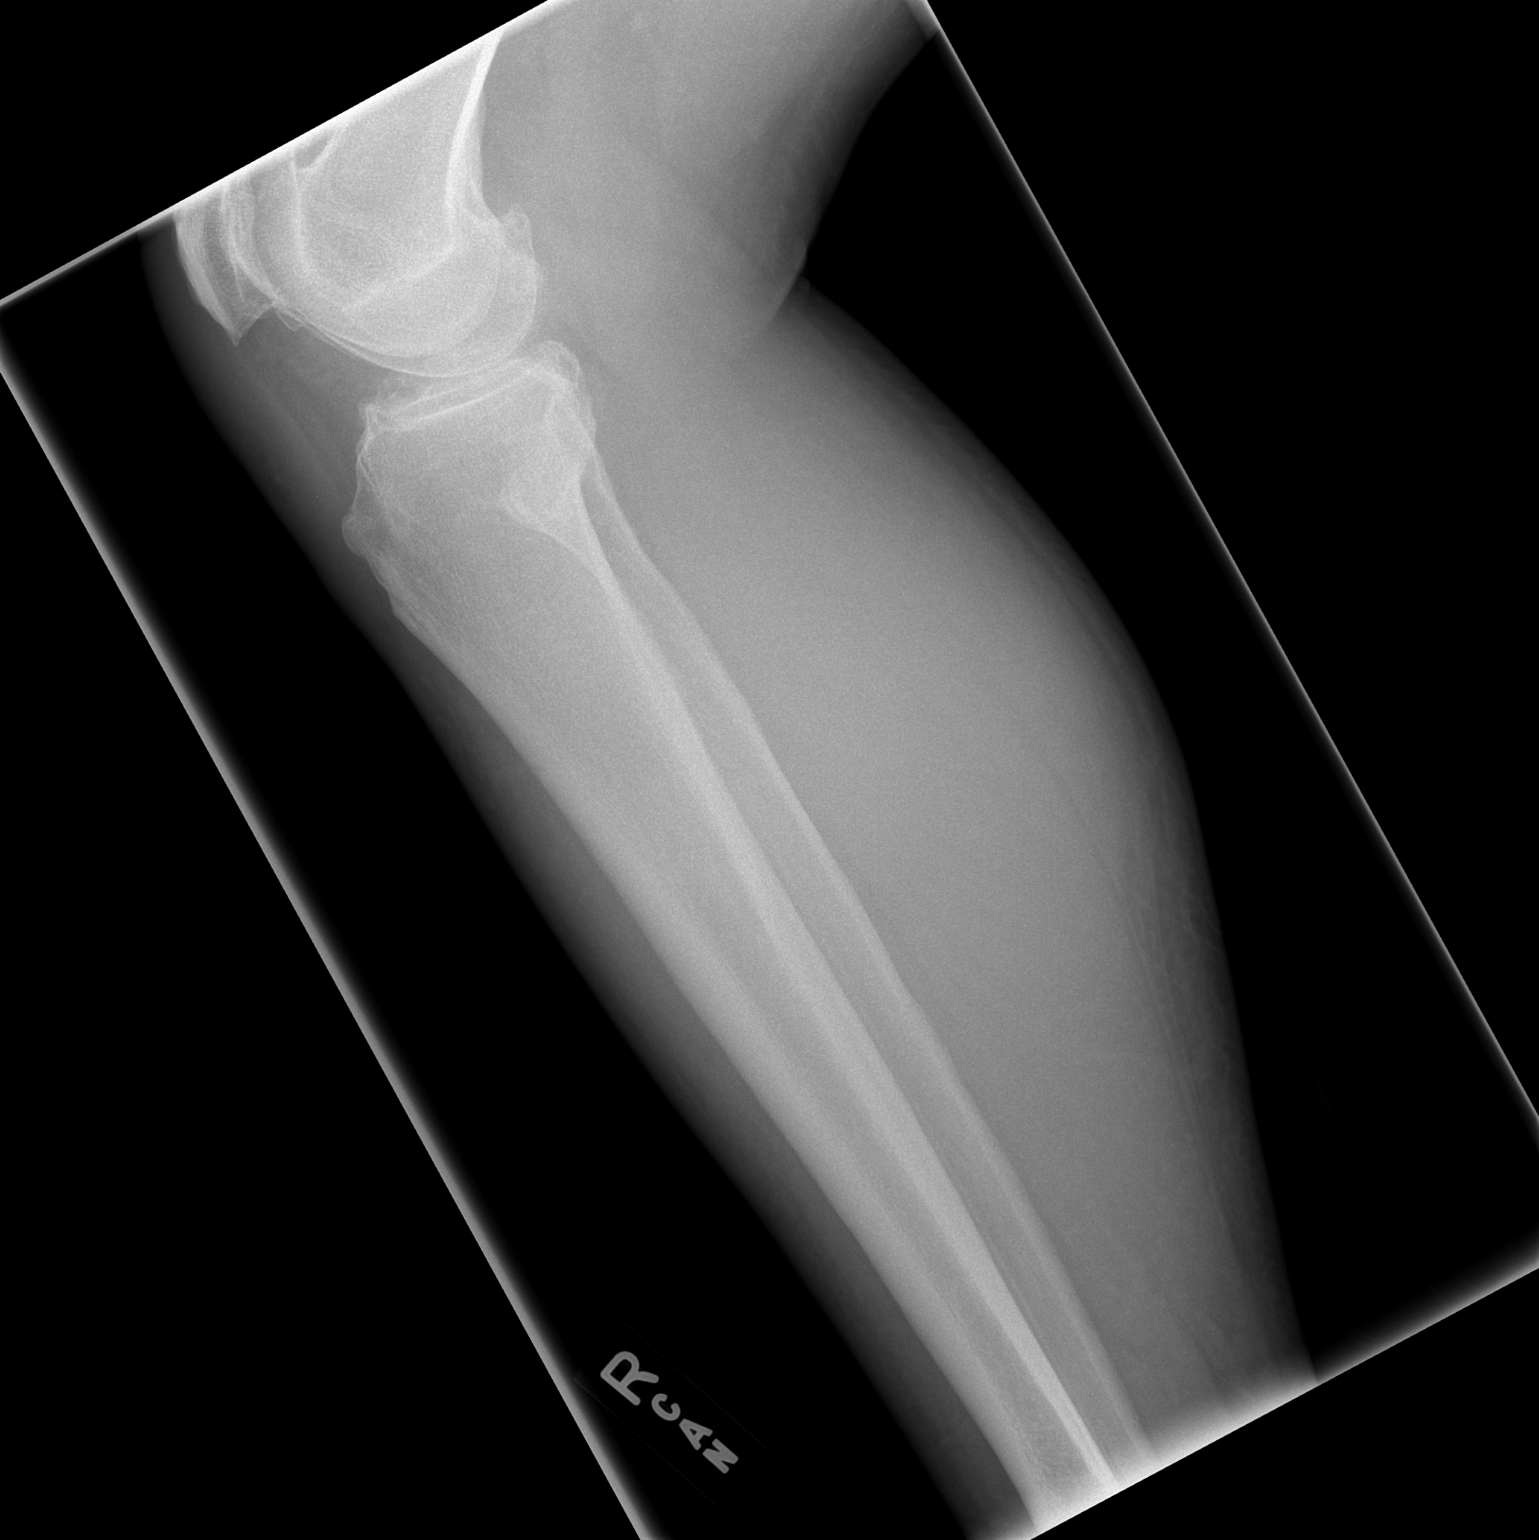

[3 of 3 positions shown; findings below may reference images not displayed]

FINDINGS: AP and lateral views of the right leg demonstrate no acute displaced
fracture of the tibia or fibula. Soft tissues are unremarkable.
Degenerative changes of osteoarthritis are noted in the knee joint
and the tibiotalar joint.
IMPRESSION: 1. No acute radiographic abnormality of the right tibia or fibula.

## 2015-07-18 ENCOUNTER — Ambulatory Visit: Payer: 59 | Admitting: Physical Therapy

## 2015-07-26 ENCOUNTER — Ambulatory Visit: Payer: 59 | Admitting: Physical Therapy

## 2015-08-11 ENCOUNTER — Ambulatory Visit: Payer: 59 | Attending: Sports Medicine | Admitting: Physical Therapy

## 2015-08-11 ENCOUNTER — Encounter: Payer: Self-pay | Admitting: Physical Therapy

## 2015-08-11 DIAGNOSIS — M5442 Lumbago with sciatica, left side: Secondary | ICD-10-CM | POA: Diagnosis not present

## 2015-08-11 DIAGNOSIS — R262 Difficulty in walking, not elsewhere classified: Secondary | ICD-10-CM | POA: Insufficient documentation

## 2015-08-11 NOTE — Therapy (Signed)
[facility] [address] [place], [place], [address] Phone: [phone]   Fax:  [phone]  Physical Therapy Evaluation  Patient Details  Name: Hans EdenRichard F Flinchum MRN: [id number] Date of Birth: 07/17/1955 Referring Provider: Pati GalloJames Kramer  Encounter Date: 08/11/2015      PT End of Session - 08/11/15 1007    Visit Number 1   Date for PT Re-Evaluation 10/11/15   PT Start Time 0944   PT Stop Time 1038   PT Time Calculation (min) 54 min   Activity Tolerance Patient tolerated treatment well   Behavior During Therapy [facility] for tasks assessed/performed      Past Medical History  Diagnosis Date  . Diabetes mellitus without complication (HCC)   . Hypertension     Past Surgical History  Procedure Laterality Date  . Hernia repair    . Foot surgery      There were no vitals filed for this visit.       Subjective Assessment - 08/11/15 0945    Subjective Patient reports that he felt pain when bending over about 4 weeks ago, he has continued to have right low back and buttock pain, reports a "jolt" type pain.  No pain past the buttock, was seen here a few years ago for the same thing with great relief from estim and traction   Limitations Lifting;Walking;House hold activities   Patient Stated Goals have less pain   Currently in Pain? Yes   Pain Score 5    Pain Location Back   Pain Orientation Right;Lower   Pain Descriptors / Indicators Aching;Shooting;Stabbing   Pain Type Chronic pain   Pain Onset 1 to 4 weeks ago   Pain Frequency Constant   Aggravating Factors  Pain a 9-10/10 bending over, standing, reports difficulty sleeping.   Pain Relieving Factors Salonpas helps some, rest   Effect of Pain on Daily Activities limits everything            [facility] PT Assessment - 08/11/15 0001    Assessment   Medical Diagnosis LBP   Referring Provider Pati GalloJames Kramer   Onset Date/Surgical Date 07/11/15   Prior Therapy yes  about 2 years ago   Precautions   Precautions None   Balance Screen   Has the patient fallen in the past 6 months No   Has the patient had a decrease in activity level because of a fear of falling?  No   Is the patient reluctant to leave their home because of a fear of falling?  No   Home Environment   Additional Comments no stairs   Prior Function   Level of Independence Independent   Vocation Part time employment   Vocation Requirements when he works he is on his feet and doing some lifting   Leisure walks   Posture/Postural Control   Posture Comments fwd head, rounded shoulders   ROM / Strength   AROM / PROM / Strength AROM;Strength   AROM   Overall AROM Comments Lumbar ROM decreased 75% with pain in the left buttock area   Strength   Overall Strength Comments left LE 4/5 with some pain in the left buttock, right LE 4+/5   Flexibility   Soft Tissue Assessment /Muscle Length --  tight calf, HS and ITB   Palpation   Palpation comment he is very tight in the left lumbar paraspinals, non tender except mild in the left buttock area  [facility] Adult PT Treatment/Exercise - 08/11/15 0001    Modalities   Modalities Electrical Stimulation;Moist Heat   Moist Heat Therapy   Number Minutes Moist Heat 15 Minutes   Moist Heat Location Lumbar Spine   Electrical Stimulation   Electrical Stimulation Location left lumbar/buttock   Electrical Stimulation Action IFC   Electrical Stimulation Parameters supine   Electrical Stimulation Goals Pain                PT Education - 08/11/15 1007    Education provided Yes   Education Details LE flexibility exercises   Person(s) Educated Patient   Methods Explanation;Demonstration;Handout   Comprehension Verbalized understanding          PT Short Term Goals - 08/11/15 1030    PT SHORT TERM GOAL #1   Title independent with initial HEP   Time 1   Period Weeks   Status New           PT Long Term  Goals - 08/11/15 1031    PT LONG TERM GOAL #1   Title understand proper posture and body mechanics   Time 8   Period Weeks   Status New   PT LONG TERM GOAL #2   Title decrease pain 50%   Time 8   Period Weeks   Status New   PT LONG TERM GOAL #3   Title increase lumbar ROM 25%   Time 8   Period Weeks   Status New   PT LONG TERM GOAL #4   Title tolerate walking 500 feet   Time 8   Period Weeks   Status New               Plan - 08/11/15 1017    Clinical Impression Statement Patient with left LBP about 3-4 weeks ago, he reports a shooting pain in the left buttock, some numbness in the left anterior thigh.  He has had difficulty recently walking > 150 feet due to pain.  He was deen here for the same thing about 2 years ago with great relief with traction.  He has significant spasm in the left lumbar area.   Rehab Potential Good   PT Frequency 2x / week   PT Duration 8 weeks   PT Treatment/Interventions ADLs/Self Care Home Management;Electrical Stimulation;Moist Heat;Traction;Ultrasound;Functional mobility training;Therapeutic activities;Therapeutic exercise;Manual techniques;Patient/family education   PT Next Visit Plan may try traction next visit, see if stretches are okay   Consulted and Agree with Plan of Care Patient      Patient will benefit from skilled therapeutic intervention in order to improve the following deficits and impairments:  Decreased mobility, Decreased strength, Decreased range of motion, Difficulty walking, Impaired flexibility, Pain, Improper body mechanics, Postural dysfunction  Visit Diagnosis: Left-sided low back pain with left-sided sciatica - Plan: PT plan of care cert/re-cert  Difficulty in walking, not elsewhere classified - Plan: PT plan of care cert/re-cert     Problem List There are no active problems to display for this patient.   Jearld Lesch., PT 08/11/2015, 10:35 AM  [facility] [place]  [facility] [address] [place], [place], [address] Phone: [phone]   Fax:  [phone]  Name: KENDRYCK LACROIX MRN: [id number] Date of Birth: [birth year]

## 2015-08-14 ENCOUNTER — Encounter: Payer: Self-pay | Admitting: Physical Therapy

## 2015-08-14 ENCOUNTER — Ambulatory Visit: Payer: 59 | Attending: Sports Medicine | Admitting: Physical Therapy

## 2015-08-14 DIAGNOSIS — M5442 Lumbago with sciatica, left side: Secondary | ICD-10-CM | POA: Diagnosis not present

## 2015-08-14 DIAGNOSIS — R262 Difficulty in walking, not elsewhere classified: Secondary | ICD-10-CM | POA: Insufficient documentation

## 2015-08-14 NOTE — Therapy (Signed)
[facility] [address] [place], [place], [address] Phone: [phone]   Fax:  [phone]  Physical Therapy Treatment  Patient Details  Name: Dominic EdenRichard F Talford MRN: [id number] Date of Birth: 04/11/1956 Referring Provider: Pati GalloJames Kramer  Encounter Date: 08/14/2015      PT End of Session - 08/14/15 0930    Visit Number 2   Date for PT Re-Evaluation 10/11/15   PT Start Time 0835   PT Stop Time 0935   PT Time Calculation (min) 60 min   Activity Tolerance Patient tolerated treatment well   Behavior During Therapy [facility] for tasks assessed/performed      Past Medical History  Diagnosis Date  . Diabetes mellitus without complication (HCC)   . Hypertension     Past Surgical History  Procedure Laterality Date  . Hernia repair    . Foot surgery      There were no vitals filed for this visit.      Subjective Assessment - 08/14/15 0831    Subjective Patient reports that he is feeling pretty good, reports he overdid it around the house over the weekend and is a little sore in the low back.   Currently in Pain? Yes   Pain Score 3    Pain Location Back   Pain Orientation Right;Lower   Pain Descriptors / Indicators Aching   Pain Type Chronic pain                         [facility] Adult PT Treatment/Exercise - 08/14/15 0001    Exercises   Exercises Lumbar   Lumbar Exercises: Stretches   Passive Hamstring Stretch 2 reps;30 seconds   Double Knee to Chest Stretch --  15 reps 3-5 sec hold   Double Knee to Chest Stretch Limitations with green ball   Piriformis Stretch 2 reps;30 seconds   Lumbar Exercises: Aerobic   Tread Mill NuStep Level 5 x 6 minutes   Lumbar Exercises: Standing   Other Standing Lumbar Exercises Standing calf stretch 3x20 sec   Other Standing Lumbar Exercises Hip extension 10# x10 each side   Lumbar Exercises: Seated   Other Seated Lumbar Exercises seated rows/lats 45# 2x15    Modalities   Modalities Moist Heat;Traction   Moist Heat Therapy   Number Minutes Moist Heat 15 Minutes   Moist Heat Location Lumbar Spine   Traction   Type of Traction Lumbar   Max (lbs) 95   Hold Time Static   Time 15                  PT Short Term Goals - 08/11/15 1030    PT SHORT TERM GOAL #1   Title independent with initial HEP   Time 1   Period Weeks   Status New           PT Long Term Goals - 08/11/15 1031    PT LONG TERM GOAL #1   Title understand proper posture and body mechanics   Time 8   Period Weeks   Status New   PT LONG TERM GOAL #2   Title decrease pain 50%   Time 8   Period Weeks   Status New   PT LONG TERM GOAL #3   Title increase lumbar ROM 25%   Time 8   Period Weeks   Status New   PT LONG TERM GOAL #4   Title tolerate walking 500 feet  Time 8   Period Weeks   Status New               Plan - 08/14/15 1015    Clinical Impression Statement Patient tolerated all interventions well, reporting strong stretch in HS and piriformis. Patient received major relief from lumbar traction. Slightly more pain and pull in left glute origin during hip extension exercise.   PT Treatment/Interventions ADLs/Self Care Home Management;Electrical Stimulation;Moist Heat;Traction;Ultrasound;Functional mobility training;Therapeutic activities;Therapeutic exercise;Manual techniques;Patient/family education   PT Next Visit Plan Continue traction for pain, progress ther ex and stretch.      Patient will benefit from skilled therapeutic intervention in order to improve the following deficits and impairments:     Visit Diagnosis: Left-sided low back pain with left-sided sciatica  Difficulty in walking, not elsewhere classified     Problem List There are no active problems to display for this patient.   Hasana Alcorta SPTA 08/14/2015, 10:21 AM  [facility] [address] [place], [place], [address] Phone: [phone]   Fax:  [phone]  Name: DEMARIE HYNEMAN MRN: [id number] Date of Birth: Mar 29, 1956

## 2015-08-16 ENCOUNTER — Encounter: Payer: Self-pay | Admitting: Physical Therapy

## 2015-08-16 ENCOUNTER — Ambulatory Visit: Payer: 59 | Admitting: Physical Therapy

## 2015-08-16 DIAGNOSIS — M5442 Lumbago with sciatica, left side: Secondary | ICD-10-CM

## 2015-08-16 DIAGNOSIS — R262 Difficulty in walking, not elsewhere classified: Secondary | ICD-10-CM

## 2015-08-16 NOTE — Therapy (Signed)
[facility] [place] [address] [place], [place], [address] Phone: [phone]   Fax:  [phone]  Physical Therapy Treatment  Patient Details  Name: Dominic Johnson MRN: [id number] Date of Birth: 12/26/1955 Referring Provider: Pati Gallo  Encounter Date: 08/16/2015      PT End of Session - 08/16/15 1016    Visit Number 3   Date for PT Re-Evaluation 10/11/15   PT Start Time 0938   PT Stop Time 1033   PT Time Calculation (min) 55 min   Activity Tolerance Patient tolerated treatment well   Behavior During Therapy [facility] for tasks assessed/performed      Past Medical History  Diagnosis Date  . Diabetes mellitus without complication (HCC)   . Hypertension     Past Surgical History  Procedure Laterality Date  . Hernia repair    . Foot surgery      There were no vitals filed for this visit.      Subjective Assessment - 08/16/15 0941    Subjective Patient reports dull ache in his glutes today, possibly from repetitive bending over working on a trailer. Stated he is looking forward to heat and traction again.    Currently in Pain? Yes   Pain Score 4    Pain Location Back                         [facility] Adult PT Treatment/Exercise - 08/16/15 0001    Lumbar Exercises: Stretches   Passive Hamstring Stretch 2 reps;30 seconds   Double Knee to Chest Stretch --  15 reps with 5 sec hold   Double Knee to Chest Stretch Limitations with green ball   Piriformis Stretch 2 reps;30 seconds   Lumbar Exercises: Aerobic   Tread Mill NuStep Level 5 x 6 minutes   Lumbar Exercises: Standing   Other Standing Lumbar Exercises Standing calf stretch 3x30 sec   Other Standing Lumbar Exercises Hip extension 15# x10 each side   Lumbar Exercises: Seated   Sit to Stand Limitations 10 reps without UE use   Other Seated Lumbar Exercises seated rows/lats 55# 2x15   Modalities   Modalities Moist Heat;Traction   Moist Heat  Therapy   Number Minutes Moist Heat 15 Minutes   Moist Heat Location Lumbar Spine   Traction   Type of Traction Lumbar   Max (lbs) 95   Hold Time Static   Time 15                  PT Short Term Goals - 08/11/15 1030    PT SHORT TERM GOAL #1   Title independent with initial HEP   Time 1   Period Weeks   Status New           PT Long Term Goals - 08/11/15 1031    PT LONG TERM GOAL #1   Title understand proper posture and body mechanics   Time 8   Period Weeks   Status New   PT LONG TERM GOAL #2   Title decrease pain 50%   Time 8   Period Weeks   Status New   PT LONG TERM GOAL #3   Title increase lumbar ROM 25%   Time 8   Period Weeks   Status New   PT LONG TERM GOAL #4   Title tolerate walking 500 feet   Time 8   Period Weeks   Status New  Plan - 08/16/15 1017    Clinical Impression Statement Patient tolerated all interventions and increased weights well, reporting tight calves, glutes, HS, and piriformis. Aching glutes after working on trailer yesterday. Continues to have slightly more pain and tightness in left glute. Received great relief from lumbar traction and MHP.   PT Treatment/Interventions ADLs/Self Care Home Management;Electrical Stimulation;Moist Heat;Traction;Ultrasound;Functional mobility training;Therapeutic activities;Therapeutic exercise;Manual techniques;Patient/family education   PT Next Visit Plan Progress ther ex and stretches. Continue traction and MHP for pain.      Patient will benefit from skilled therapeutic intervention in order to improve the following deficits and impairments:     Visit Diagnosis: Left-sided low back pain with left-sided sciatica  Difficulty in walking, not elsewhere classified     Problem List There are no active problems to display for this patient.   Cyndel Griffey SPTA 08/16/2015, 10:35 AM  [facility] [place] [address] [place], [place], [address] Phone: [phone]   Fax:  [phone]  Name: Dominic Johnson MRN: [id number] Date of Birth: 04/17/1955

## 2015-08-21 ENCOUNTER — Encounter: Payer: Self-pay | Admitting: Physical Therapy

## 2015-08-21 ENCOUNTER — Ambulatory Visit: Payer: 59 | Admitting: Physical Therapy

## 2015-08-21 DIAGNOSIS — R262 Difficulty in walking, not elsewhere classified: Secondary | ICD-10-CM

## 2015-08-21 DIAGNOSIS — M5442 Lumbago with sciatica, left side: Secondary | ICD-10-CM | POA: Diagnosis not present

## 2015-08-21 NOTE — Therapy (Signed)
[facility] [address] [place], [place], [address] Phone: [phone]   Fax:  [phone]  Physical Therapy Treatment  Patient Details  Name: Dominic Johnson MRN: [id number] Date of Birth: 06/16/1955 Referring Provider: Pati GalloJames Kramer  Encounter Date: 08/21/2015      PT End of Session - 08/21/15 0924    Visit Number 4   Date for PT Re-Evaluation 10/11/15   PT Start Time 0840   PT Stop Time 0939   PT Time Calculation (min) 59 min   Activity Tolerance Patient tolerated treatment well   Behavior During Therapy [facility] for tasks assessed/performed      Past Medical History  Diagnosis Date  . Diabetes mellitus without complication (HCC)   . Hypertension     Past Surgical History  Procedure Laterality Date  . Hernia repair    . Foot surgery      There were no vitals filed for this visit.      Subjective Assessment - 08/21/15 0839    Subjective "Im a little sore in my hip today, Had a busy weekend"   Currently in Pain? Yes   Pain Score 6    Pain Location Hip   Pain Orientation Left                         [facility] Adult PT Treatment/Exercise - 08/21/15 0001    Lumbar Exercises: Stretches   Passive Hamstring Stretch 4 reps;10 seconds   Piriformis Stretch 2 reps;30 seconds   Lumbar Exercises: Aerobic   Tread Mill NuStep Level 5 x 6 minutes   Lumbar Exercises: Standing   Other Standing Lumbar Exercises Standing OHP blue ball 2x15; Standing shoulder Ext Green Tband 2x15     Other Standing Lumbar Exercises Hip extension 15# x10 each side; Hell taps on 8in box x10 each   Lumbar Exercises: Seated   Sit to Stand Limitations 10 reps 2 sets  without UE use   Other Seated Lumbar Exercises seated rows/lats 55# 2x15   Modalities   Modalities Moist Heat;Traction   Moist Heat Therapy   Number Minutes Moist Heat 15 Minutes   Moist Heat Location Lumbar Spine   Traction   Type of Traction Lumbar   Max  (lbs) 99   Hold Time Static   Time 15                  PT Short Term Goals - 08/11/15 1030    PT SHORT TERM GOAL #1   Title independent with initial HEP   Time 1   Period Weeks   Status New           PT Long Term Goals - 08/11/15 1031    PT LONG TERM GOAL #1   Title understand proper posture and body mechanics   Time 8   Period Weeks   Status New   PT LONG TERM GOAL #2   Title decrease pain 50%   Time 8   Period Weeks   Status New   PT LONG TERM GOAL #3   Title increase lumbar ROM 25%   Time 8   Period Weeks   Status New   PT LONG TERM GOAL #4   Title tolerate walking 500 feet   Time 8   Period Weeks   Status New               Plan - 08/21/15 [id number]  Clinical Impression Statement Pt again tolerated all interventions well, Tight bilat HS remain, Pre treatment Pt reports L hip soreness that he stated was completely gone post treatment.    Rehab Potential Good   PT Frequency 2x / week   PT Duration 8 weeks   PT Treatment/Interventions ADLs/Self Care Home Management;Electrical Stimulation;Moist Heat;Traction;Ultrasound;Functional mobility training;Therapeutic activities;Therapeutic exercise;Dominic techniques;Patient/family education   PT Next Visit Plan Progress ther ex and stretches. Continue traction and MHP for pain.      Patient will benefit from skilled therapeutic intervention in order to improve the following deficits and impairments:  Decreased mobility, Decreased strength, Decreased range of motion, Difficulty walking, Impaired flexibility, Pain, Improper body mechanics, Postural dysfunction  Visit Diagnosis: Left-sided low back pain with left-sided sciatica  Difficulty in walking, not elsewhere classified     Problem List There are no active problems to display for this patient.   Grayce Sessions, PTA 08/21/2015, 9:28 AM  [facility] [place] [address] [place],  [place], [address] Phone: [phone]   Fax:  [phone]  Name: Dominic Johnson MRN: [id number] Date of Birth: 1955/09/14

## 2015-08-24 ENCOUNTER — Ambulatory Visit: Payer: 59 | Admitting: Physical Therapy

## 2015-08-24 ENCOUNTER — Encounter: Payer: Self-pay | Admitting: Physical Therapy

## 2015-08-24 DIAGNOSIS — M5442 Lumbago with sciatica, left side: Secondary | ICD-10-CM | POA: Diagnosis not present

## 2015-08-24 DIAGNOSIS — R262 Difficulty in walking, not elsewhere classified: Secondary | ICD-10-CM

## 2015-08-24 NOTE — Therapy (Signed)
[facility] [place] [address] [place], [place], [address] Phone: [phone]   Fax:  [phone]  Physical Therapy Treatment  Patient Details  Name: Dominic Johnson MRN: [id number] Date of Birth: 11/29/1955 Referring Provider: Pati GalloJames Kramer  Encounter Date: 08/24/2015      PT End of Session - 08/24/15 0930    Visit Number 5   Date for PT Re-Evaluation 10/11/15   PT Start Time 0844   PT Stop Time 0944   PT Time Calculation (min) 60 min   Activity Tolerance Patient tolerated treatment well   Behavior During Therapy [facility] for tasks assessed/performed      Past Medical History  Diagnosis Date  . Diabetes mellitus without complication (HCC)   . Hypertension     Past Surgical History  Procedure Laterality Date  . Hernia repair    . Foot surgery      There were no vitals filed for this visit.      Subjective Assessment - 08/24/15 0842    Subjective Discouraged, Seem like a as the day progressed I got sore to the point I was laid out on the bed.    Currently in Pain? Yes   Pain Score 6    Pain Location Hip   Pain Orientation Left                         [facility] Adult PT Treatment/Exercise - 08/24/15 0001    Lumbar Exercises: Aerobic   Tread Mill NuStep Level 4 x 7 minutes   Lumbar Exercises: Standing   Other Standing Lumbar Exercises Standing bilat shoulder flex blue ball 2x15; Standing shoulder Ext Green Tband 2x15     Lumbar Exercises: Seated   Other Seated Lumbar Exercises seated rows/lats 55# 2x15   Modalities   Modalities Electrical Stimulation;Moist Heat;Traction   Moist Heat Therapy   Number Minutes Moist Heat 15 Minutes   Moist Heat Location Lumbar Spine   Traction   Type of Traction Lumbar   Max (lbs) 90   Hold Time Static   Time 15                  PT Short Term Goals - 08/11/15 1030    PT SHORT TERM GOAL #1   Title independent with initial HEP   Time 1   Period  Weeks   Status New           PT Long Term Goals - 08/11/15 1031    PT LONG TERM GOAL #1   Title understand proper posture and body mechanics   Time 8   Period Weeks   Status New   PT LONG TERM GOAL #2   Title decrease pain 50%   Time 8   Period Weeks   Status New   PT LONG TERM GOAL #3   Title increase lumbar ROM 25%   Time 8   Period Weeks   Status New   PT LONG TERM GOAL #4   Title tolerate walking 500 feet   Time 8   Period Weeks   Status New               Plan - 08/24/15 0907    Clinical Impression Statement Conservative measures taken due to pt c/o of soreness after last session. Reports that he could  feel a sharp pain that went into a dull ache with standing interventions.   Rehab Potential Good  PT Frequency 2x / week   PT Duration 8 weeks   PT Treatment/Interventions ADLs/Self Care Home Management;Electrical Stimulation;Moist Heat;Traction;Ultrasound;Functional mobility training;Therapeutic activities;Therapeutic exercise;Manual techniques;Patient/family education   PT Next Visit Plan assess Tx      Patient will benefit from skilled therapeutic intervention in order to improve the following deficits and impairments:  Decreased mobility, Decreased strength, Decreased range of motion, Difficulty walking, Impaired flexibility, Pain, Improper body mechanics, Postural dysfunction  Visit Diagnosis: Left-sided low back pain with left-sided sciatica  Difficulty in walking, not elsewhere classified     Problem List There are no active problems to display for this patient.   Grayce Sessions, PTA  08/24/2015, 9:30 AM  [facility] [place] [address] [place], [place], [address] Phone: [phone]   Fax:  [phone]  Name: Dominic Johnson MRN: [id number] Date of Birth: [birth year]

## 2015-08-28 ENCOUNTER — Ambulatory Visit: Payer: 59 | Admitting: Physical Therapy

## 2015-08-29 ENCOUNTER — Ambulatory Visit: Payer: 59 | Admitting: Physical Therapy

## 2015-09-01 ENCOUNTER — Encounter: Payer: 59 | Admitting: Physical Therapy

## 2015-09-05 ENCOUNTER — Encounter: Payer: Self-pay | Admitting: Physical Therapy

## 2015-09-05 ENCOUNTER — Ambulatory Visit: Payer: 59 | Admitting: Physical Therapy

## 2015-09-05 DIAGNOSIS — R262 Difficulty in walking, not elsewhere classified: Secondary | ICD-10-CM

## 2015-09-05 DIAGNOSIS — M5442 Lumbago with sciatica, left side: Secondary | ICD-10-CM | POA: Diagnosis not present

## 2015-09-05 NOTE — Therapy (Signed)
[facility] [place] [address] [place], [place], [address] Phone: [phone]   Fax:  [phone]  Physical Therapy Treatment  Patient Details  Name: Dominic Johnson MRN: [id number] Date of Birth: 06-Jun-1955 Referring Provider: Pati Gallo  Encounter Date: 09/05/2015      PT End of Session - 09/05/15 0909    Visit Number 6   Date for PT Re-Evaluation 10/11/15   PT Start Time 0835   PT Stop Time 0939   PT Time Calculation (min) 64 min   Activity Tolerance Patient tolerated treatment well   Behavior During Therapy [facility] for tasks assessed/performed      Past Medical History  Diagnosis Date  . Diabetes mellitus without complication (HCC)   . Hypertension     Past Surgical History  Procedure Laterality Date  . Hernia repair    . Foot surgery      There were no vitals filed for this visit.      Subjective Assessment - 09/05/15 0838    Subjective Reports that he was on vacation the past week, was able to ride his motorcycle without much difficulty, noticed that prolonged sitting or standing would increase tightness, spasm and soreness.   Currently in Pain? Yes   Pain Score 3    Pain Location Back   Pain Orientation Lower;Left   Pain Descriptors / Indicators Aching;Spasm;Tightness   Pain Type Chronic pain   Aggravating Factors  sitting or standing > 15 minutes   Pain Relieving Factors the treatment does help                         [facility] Adult PT Treatment/Exercise - 09/05/15 0001    Lumbar Exercises: Stretches   Passive Hamstring Stretch 30 seconds;4 reps   Piriformis Stretch 4 reps;30 seconds   Lumbar Exercises: Aerobic   Tread Mill NuStep Level 4 x 7 minutes   Lumbar Exercises: Seated   Other Seated Lumbar Exercises seated rows/lats 55# 2x15, on sit/fit, pelvic mobility and stability exercises   Moist Heat Therapy   Number Minutes Moist Heat 15 Minutes   Moist Heat Location Lumbar Spine    Electrical Stimulation   Electrical Stimulation Location left lumbar/buttock   Electrical Stimulation Action IFC   Electrical Stimulation Parameters supine   Electrical Stimulation Goals Pain   Traction   Type of Traction Lumbar   Max (lbs) 95   Hold Time Static   Time 15                  PT Short Term Goals - 09/05/15 0911    PT SHORT TERM GOAL #1   Title independent with initial HEP   Status Achieved           PT Long Term Goals - 09/05/15 0911    PT LONG TERM GOAL #1   Title understand proper posture and body mechanics   Status Achieved   PT LONG TERM GOAL #2   Title decrease pain 50%   Status On-going               Plan - 09/05/15 0910    Clinical Impression Statement Patient with less pain overall, felt like sitting and standing will increase pain.  Still very tight   PT Next Visit Plan Continue to work on flexibility   Consulted and Agree with Plan of Care Patient      Patient will benefit from skilled therapeutic  intervention in order to improve the following deficits and impairments:  Decreased mobility, Decreased strength, Decreased range of motion, Difficulty walking, Impaired flexibility, Pain, Improper body mechanics, Postural dysfunction  Visit Diagnosis: Left-sided low back pain with left-sided sciatica  Difficulty in walking, not elsewhere classified     Problem List There are no active problems to display for this patient.   Jearld LeschALBRIGHT,Allene Furuya W., PT 09/05/2015, 9:11 AM  [facility] [place] [place] [address] [place], [place], [address] Phone: [phone]   Fax:  [phone]  Name: Dominic Johnson MRN: [id number] Date of Birth: 04/29/1955

## 2015-09-07 ENCOUNTER — Ambulatory Visit: Payer: 59 | Admitting: Physical Therapy

## 2015-09-07 ENCOUNTER — Encounter: Payer: Self-pay | Admitting: Physical Therapy

## 2015-09-07 DIAGNOSIS — R262 Difficulty in walking, not elsewhere classified: Secondary | ICD-10-CM

## 2015-09-07 DIAGNOSIS — M5442 Lumbago with sciatica, left side: Secondary | ICD-10-CM

## 2015-09-07 NOTE — Therapy (Signed)
[facility] [place] [address] [place], [place], [address] Phone: [phone]   Fax:  [phone]  Physical Therapy Treatment  Patient Details  Name: Dominic Johnson MRN: [id number] Date of Birth: 11/16/1955 Referring Provider: Pati GalloJames Kramer  Encounter Date: 09/07/2015      PT End of Session - 09/07/15 0927    Visit Number 7   Date for PT Re-Evaluation 10/11/15   PT Start Time 0838   PT Stop Time 0939   PT Time Calculation (min) 61 min   Activity Tolerance Patient tolerated treatment well   Behavior During Therapy [facility] for tasks assessed/performed      Past Medical History  Diagnosis Date  . Diabetes mellitus without complication (HCC)   . Hypertension     Past Surgical History  Procedure Laterality Date  . Hernia repair    . Foot surgery      There were no vitals filed for this visit.      Subjective Assessment - 09/07/15 0850    Subjective I think I am on the right track   Currently in Pain? Yes   Pain Score 3    Pain Location Back   Pain Orientation Left;Lower                         [facility] Adult PT Treatment/Exercise - 09/07/15 0001    Lumbar Exercises: Stretches   Passive Hamstring Stretch 30 seconds;4 reps   Piriformis Stretch 4 reps;30 seconds   Lumbar Exercises: Aerobic   Tread Mill NuStep Level 5 x 7 minutes   Lumbar Exercises: Standing   Other Standing Lumbar Exercises hip extension and abduction 5# 2x10   Lumbar Exercises: Seated   Other Seated Lumbar Exercises seated rows/lats 55# 2x15, on sit/fit, pelvic mobility and stability exercises   Lumbar Exercises: Supine   Other Supine Lumbar Exercises feet on ball K2C, trunk rotation and small bridges   Moist Heat Therapy   Number Minutes Moist Heat 15 Minutes   Moist Heat Location Lumbar Spine   Electrical Stimulation   Electrical Stimulation Location left lumbar/buttock   Electrical Stimulation Action IFCIFC   Electrical  Stimulation Parameters supine   Electrical Stimulation Goals Pain   Traction   Type of Traction Lumbar   Max (lbs) 95   Hold Time Static   Time 15                  PT Short Term Goals - 09/05/15 0911    PT SHORT TERM GOAL #1   Title independent with initial HEP   Status Achieved           PT Long Term Goals - 09/05/15 0911    PT LONG TERM GOAL #1   Title understand proper posture and body mechanics   Status Achieved   PT LONG TERM GOAL #2   Title decrease pain 50%   Status On-going               Plan - 09/07/15 0928    Clinical Impression Statement Had increased c/o pain and tightness iwth the hip extension and abduction, he had this previously but today I decreased the weight by 10#   PT Next Visit Plan Continue to work on flexibility   Consulted and Agree with Plan of Care Patient      Patient will benefit from skilled therapeutic intervention in order to improve the following deficits and impairments:  Decreased mobility, Decreased strength, Decreased range of motion, Difficulty walking, Impaired flexibility, Pain, Improper body mechanics, Postural dysfunction  Visit Diagnosis: Left-sided low back pain with left-sided sciatica  Difficulty in walking, not elsewhere classified     Problem List There are no active problems to display for this patient.   Jearld Lesch., PT 09/07/2015, 9:29 AM  [facility] [address] [place] [address] [place], [place], [address] Phone: [phone]   Fax:  [phone]  Name: Dominic Johnson MRN: [id number] Date of Birth: 16-Aug-1955

## 2015-09-12 ENCOUNTER — Encounter: Payer: Self-pay | Admitting: Physical Therapy

## 2015-09-12 ENCOUNTER — Ambulatory Visit: Payer: 59 | Admitting: Physical Therapy

## 2015-09-12 DIAGNOSIS — R262 Difficulty in walking, not elsewhere classified: Secondary | ICD-10-CM

## 2015-09-12 DIAGNOSIS — M5442 Lumbago with sciatica, left side: Secondary | ICD-10-CM | POA: Diagnosis not present

## 2015-09-12 NOTE — Therapy (Signed)
[facility] [place] [address] [place], [place], [address] Phone: [phone]   Fax:  [phone]  Physical Therapy Treatment  Patient Details  Name: LENARDO WESTWOOD MRN: [id number] Date of Birth: 07-02-1955 Referring Provider: Pati Gallo  Encounter Date: 09/12/2015      PT End of Session - 09/12/15 0913    Visit Number 8   Date for PT Re-Evaluation 10/11/15   PT Start Time 0837   PT Stop Time 0939   PT Time Calculation (min) 62 min   Activity Tolerance Patient tolerated treatment well   Behavior During Therapy [facility] for tasks assessed/performed      Past Medical History  Diagnosis Date  . Diabetes mellitus without complication (HCC)   . Hypertension     Past Surgical History  Procedure Laterality Date  . Hernia repair    . Foot surgery      There were no vitals filed for this visit.      Subjective Assessment - 09/12/15 0838    Subjective Patient reports that the bands with the legs increased the pain right low back.  Reports that he has been unable to get comfortable since that time   Currently in Pain? Yes   Pain Score 9    Pain Location Back   Pain Orientation Right;Lower   Pain Descriptors / Indicators Aching;Spasm;Tightness   Pain Type Chronic pain   Pain Onset 1 to 4 weeks ago                         [facility] Adult PT Treatment/Exercise - 09/12/15 0001    Lumbar Exercises: Stretches   Passive Hamstring Stretch 30 seconds;4 reps   Piriformis Stretch 4 reps;30 seconds   Modalities   Modalities Ultrasound   Moist Heat Therapy   Number Minutes Moist Heat 15 Minutes   Moist Heat Location Lumbar Spine   Electrical Stimulation   Electrical Stimulation Location left lumbar/buttock   Electrical Stimulation Action IFC   Electrical Stimulation Parameters supine   Electrical Stimulation Goals Pain   Ultrasound   Ultrasound Location right SI area   Ultrasound Parameters 100% 1.6w/cm2   Ultrasound Goals Pain   Traction   Type of Traction Lumbar   Max (lbs) 95   Hold Time Static   Time 15   Manual Therapy   Manual therapy comments STM to the right SI area                  PT Short Term Goals - 09/05/15 0911    PT SHORT TERM GOAL #1   Title independent with initial HEP   Status Achieved           PT Long Term Goals - 09/05/15 0911    PT LONG TERM GOAL #1   Title understand proper posture and body mechanics   Status Achieved   PT LONG TERM GOAL #2   Title decrease pain 50%   Status On-going               Plan - 09/12/15 0914    Clinical Impression Statement After the last visit patient again had increased back pain witht eh extension and abduction exercises of the hips, the first time was about a month ago and was with 15#, I tried again with 5# with the same result of increased pain and spasm   PT Next Visit Plan will avoid hip extension and abduction  Consulted and Agree with Plan of Care Patient      Patient will benefit from skilled therapeutic intervention in order to improve the following deficits and impairments:     Visit Diagnosis: Left-sided low back pain with left-sided sciatica  Difficulty in walking, not elsewhere classified     Problem List There are no active problems to display for this patient.   Jearld LeschALBRIGHT,Jkayla Spiewak W., PT 09/12/2015, 9:16 AM  [facility] [place] [address] [place], [place], [address] Phone: [phone]   Fax:  [phone]  Name: Hans EdenRichard F Bettes MRN: [id number] Date of Birth: 07/24/1955

## 2015-09-13 ENCOUNTER — Ambulatory Visit: Payer: 59 | Admitting: Physical Therapy

## 2015-09-13 ENCOUNTER — Encounter: Payer: Self-pay | Admitting: Physical Therapy

## 2015-09-13 DIAGNOSIS — M5442 Lumbago with sciatica, left side: Secondary | ICD-10-CM

## 2015-09-13 DIAGNOSIS — R262 Difficulty in walking, not elsewhere classified: Secondary | ICD-10-CM

## 2015-09-13 NOTE — Therapy (Signed)
[facility] [address] [place], [place], [address] Phone: [phone]   Fax:  [phone]  Physical Therapy Treatment  Patient Details  Name: Dominic Johnson MRN: [id number] Date of Birth: 07/03/1955 Referring Provider: Pati GalloJames Kramer  Encounter Date: 09/13/2015      PT End of Session - 09/13/15 1134    Visit Number 9   Date for PT Re-Evaluation 10/11/15   PT Start Time 1115   PT Stop Time 1142   PT Time Calculation (min) 27 min   Activity Tolerance Patient tolerated treatment well   Behavior [facility] Therapy [facility] for tasks assessed/performed      Past Medical History  Diagnosis Date  . Diabetes mellitus without complication (HCC)   . Hypertension     Past Surgical History  Procedure Laterality Date  . Hernia repair    . Foot surgery      There were no vitals filed for this visit.      Subjective Assessment - 09/13/15 1133    Subjective Due to the increase of his pain and his upcoming vacation we worked him in today.  Reports that pain is better today after treatment yesterday   Currently in Pain? Yes   Pain Score 5    Pain Location Back                         [facility] Adult PT Treatment/Exercise - 09/13/15 0001    Traction   Type of Traction Lumbar   Max (lbs) 95   Hold Time Static   Time 15                  PT Short Term Goals - 09/05/15 0911    PT SHORT TERM GOAL #1   Title independent with initial HEP   Status Achieved           PT Long Term Goals - 09/05/15 0911    PT LONG TERM GOAL #1   Title understand proper posture and body mechanics   Status Achieved   PT LONG TERM GOAL #2   Title decrease pain 50%   Status On-going               Plan - 09/13/15 1134    Clinical Impression Statement reported that the traction seems to help him most.  He was worked in today to get him feeling better for his upcoming vacation   PT Next Visit Plan will avoid  hip extension and abduction   Consulted and Agree with Plan of Care Patient      Patient will benefit from skilled therapeutic intervention in order to improve the following deficits and impairments:  Decreased mobility, Decreased strength, Decreased range of motion, Difficulty walking, Impaired flexibility, Pain, Improper body mechanics, Postural dysfunction  Visit Diagnosis: Left-sided low back pain with left-sided sciatica  Difficulty in walking, not elsewhere classified     Problem List There are no active problems to display for this patient.   Jearld LeschALBRIGHT,Valyn Latchford W., PT 09/13/2015, 11:36 AM  [facility] [address] [address] [place], [place], [address] Phone: [phone]   Fax:  [phone]  Name: Dominic Johnson MRN: [id number] Date of Birth: 07/02/1955

## 2015-09-15 ENCOUNTER — Ambulatory Visit: Payer: 59 | Attending: Sports Medicine | Admitting: Physical Therapy

## 2015-09-15 ENCOUNTER — Encounter: Payer: Self-pay | Admitting: Physical Therapy

## 2015-09-15 DIAGNOSIS — M5442 Lumbago with sciatica, left side: Secondary | ICD-10-CM | POA: Diagnosis not present

## 2015-09-15 DIAGNOSIS — R262 Difficulty in walking, not elsewhere classified: Secondary | ICD-10-CM | POA: Diagnosis present

## 2015-09-15 NOTE — Therapy (Signed)
[facility] [place] [address] [place], [place], [address] Phone: [phone]   Fax:  [phone]  Physical Therapy Treatment  Patient Details  Name: Dominic Johnson MRN: [id number] Date of Birth: [birth year] Referring Provider: Pati Gallo  Encounter Date: 09/15/2015      PT End of Session - 09/15/15 0922    Visit Number 10   Date for PT Re-Evaluation 10/11/15   PT Start Time 0837   PT Stop Time 0935   PT Time Calculation (min) 58 min   Activity Tolerance Patient tolerated treatment well   Behavior During Therapy [facility] for tasks assessed/performed      Past Medical History  Diagnosis Date  . Diabetes mellitus without complication (HCC)   . Hypertension     Past Surgical History  Procedure Laterality Date  . Hernia repair    . Foot surgery      There were no vitals filed for this visit.      Subjective Assessment - 09/15/15 0920    Subjective "I am feeling much better with this current routine"   Currently in Pain? Yes   Pain Score 3    Pain Location Back   Aggravating Factors  any of the kicking exercises   Pain Relieving Factors treatment                         [facility] Adult PT Treatment/Exercise - 09/15/15 0001    Lumbar Exercises: Stretches   Passive Hamstring Stretch 30 seconds;4 reps   Piriformis Stretch 4 reps;30 seconds   Moist Heat Therapy   Number Minutes Moist Heat 15 Minutes   Moist Heat Location Lumbar Spine   Electrical Stimulation   Electrical Stimulation Location left lumbar/buttock   Electrical Stimulation Action IFC   Electrical Stimulation Parameters supine   Electrical Stimulation Goals Pain   Ultrasound   Ultrasound Location right SI    Ultrasound Parameters 100%   Ultrasound Goals Pain   Traction   Type of Traction Lumbar   Max (lbs) 95   Hold Time Static   Time 15   Manual Therapy   Manual therapy comments STM to the right SI area                   PT Short Term Goals - 09/05/15 0911    PT SHORT TERM GOAL #1   Title independent with initial HEP   Status Achieved           PT Long Term Goals - 09/15/15 6962    PT LONG TERM GOAL #2   Title decrease pain 50%   Status On-going   PT LONG TERM GOAL #3   Title increase lumbar ROM 25%   Status Achieved   PT LONG TERM GOAL #4   Title tolerate walking 500 feet   Status On-going               Plan - 09/15/15 9528    Clinical Impression Statement Doing much better with traction, his LE flexibility is better and he is less tender   PT Next Visit Plan he is going on vacation next week, we will see him twice before he leaves   Consulted and Agree with Plan of Care Patient      Patient will benefit from skilled therapeutic intervention in order to improve the following deficits and impairments:  Decreased mobility, Decreased strength, Decreased range of motion, Difficulty  walking, Impaired flexibility, Pain, Improper body mechanics, Postural dysfunction  Visit Diagnosis: Left-sided low back pain with left-sided sciatica  Difficulty in walking, not elsewhere classified     Problem List There are no active problems to display for this patient.   Jearld LeschALBRIGHT,Arie Powell W., PT 09/15/2015, 9:24 AM  [facility] [address] [address] [place], [place], [address] Phone: [phone]   Fax:  [phone]  Name: Dominic Johnson MRN: [id number] Date of Birth: 12/02/1955

## 2015-09-18 ENCOUNTER — Ambulatory Visit: Payer: 59 | Admitting: Physical Therapy

## 2015-09-18 DIAGNOSIS — M5442 Lumbago with sciatica, left side: Secondary | ICD-10-CM

## 2015-09-18 DIAGNOSIS — R262 Difficulty in walking, not elsewhere classified: Secondary | ICD-10-CM

## 2015-09-18 NOTE — Therapy (Signed)
[facility] [place] [facility] [address] [place], [place], [address] Phone: [phone]   Fax:  [phone]  Physical Therapy Treatment  Patient Details  Name: Dominic Johnson MRN: [id number] Date of Birth: 1955/06/03 Referring Provider: Pati Gallo  Encounter Date: 09/18/2015      PT End of Session - 09/18/15 0919    Visit Number 11   Date for PT Re-Evaluation 10/11/15   PT Start Time 0839   PT Stop Time 0935   PT Time Calculation (min) 56 min   Activity Tolerance Patient tolerated treatment well   Behavior During Therapy [facility] for tasks assessed/performed      Past Medical History  Diagnosis Date  . Diabetes mellitus without complication (HCC)   . Hypertension     Past Surgical History  Procedure Laterality Date  . Hernia repair    . Foot surgery      There were no vitals filed for this visit.      Subjective Assessment - 09/18/15 0918    Subjective Continuing do feel better.  Less pain.   Currently in Pain? Yes   Pain Score 2    Pain Location Back   Pain Orientation Right;Lower                         [facility] Adult PT Treatment/Exercise - 09/18/15 0001    Lumbar Exercises: Stretches   Passive Hamstring Stretch 30 seconds;4 reps   Piriformis Stretch 4 reps;30 seconds   Moist Heat Therapy   Number Minutes Moist Heat 15 Minutes   Moist Heat Location Lumbar Spine   Electrical Stimulation   Electrical Stimulation Location left lumbar/buttock   Electrical Stimulation Action IFC   Electrical Stimulation Parameters supine   Electrical Stimulation Goals Pain   Traction   Type of Traction Lumbar   Max (lbs) 95   Hold Time Static   Time 15   Manual Therapy   Manual therapy comments STM to the right SI area                  PT Short Term Goals - 09/05/15 0911    PT SHORT TERM GOAL #1   Title independent with initial HEP   Status Achieved           PT Long Term Goals - 09/15/15  6962    PT LONG TERM GOAL #2   Title decrease pain 50%   Status On-going   PT LONG TERM GOAL #3   Title increase lumbar ROM 25%   Status Achieved   PT LONG TERM GOAL #4   Title tolerate walking 500 feet   Status On-going               Plan - 09/18/15 0920    Clinical Impression Statement HS get very tight over the weekend.  He will be going on vacation on Thursday.  Less spasms in the parapsinals but still a knot in the left SI area   PT Next Visit Plan continue current plan until after his vacation      Patient will benefit from skilled therapeutic intervention in order to improve the following deficits and impairments:     Visit Diagnosis: Left-sided low back pain with left-sided sciatica  Difficulty in walking, not elsewhere classified     Problem List There are no active problems to display for this patient.   Jearld Lesch., PT 09/18/2015, 9:22 AM  [facility] [address] [place], [place], [address] Phone: [phone]   Fax:  [phone]  Name: Dominic Johnson MRN: [id number] Date of Birth: 01/21/1956

## 2015-09-20 ENCOUNTER — Ambulatory Visit: Payer: 59 | Admitting: Physical Therapy

## 2015-09-20 ENCOUNTER — Encounter: Payer: Self-pay | Admitting: Physical Therapy

## 2015-09-20 DIAGNOSIS — R262 Difficulty in walking, not elsewhere classified: Secondary | ICD-10-CM

## 2015-09-20 DIAGNOSIS — M5442 Lumbago with sciatica, left side: Secondary | ICD-10-CM

## 2015-09-20 NOTE — Therapy (Signed)
[place], [place], [address] Phone: [phone]   Fax:  [phone]  Physical Therapy Treatment  Patient Details  Name: Dominic Johnson MRN: [id number] Date of Birth: Feb 04, 1956 Referring Provider: Berle Mull  Encounter Date: 09/20/2015      PT End of Session - 09/20/15 0913    Visit Number 12   Date for PT Re-Evaluation 10/11/15   PT Start Time 0838   PT Stop Time 0933   PT Time Calculation (min) 55 min   Activity Tolerance Patient tolerated treatment well   Behavior During Therapy [facility] for tasks assessed/performed      Past Medical History  Diagnosis Date  . Diabetes mellitus without complication ([place])   . Hypertension     Past Surgical History  Procedure Laterality Date  . Hernia repair    . Foot surgery      There were no vitals filed for this visit.      Subjective Assessment - 09/20/15 0912    Subjective I think I am going to make it, "I didn't think I would be able to go on this vacation last week due to the pain, this has really helped."   Currently in Pain? Yes   Pain Score 2    Pain Location Back   Pain Orientation Right;Lower   Pain Relieving Factors treatment                         [facility] Adult PT Treatment/Exercise - 09/20/15 0001    Lumbar Exercises: Stretches   Passive Hamstring Stretch 30 seconds;4 reps   Piriformis Stretch 4 reps;30 seconds   Moist Heat Therapy   Number Minutes Moist Heat 15 Minutes   Moist Heat Location Lumbar Spine   Electrical Stimulation   Electrical Stimulation Location left lumbar/buttock   Electrical Stimulation Action IFC   Electrical Stimulation Parameters supine   Electrical Stimulation Goals Pain   Ultrasound   Ultrasound Location right SI area   Ultrasound Parameters 100% 1MHz   Ultrasound Goals Pain   Traction   Type of Traction Lumbar   Max (lbs) 95   Hold Time Static   Time 15   Manual Therapy   Manual therapy comments STM to the right SI area                  PT Short Term Goals - 09/05/15 0911    PT SHORT TERM GOAL #1   Title independent with initial HEP   Status Achieved           PT Long Term Goals - 09/20/15 0915    PT LONG TERM GOAL #1   Title understand proper posture and body mechanics   Status Achieved   PT LONG TERM GOAL #2   Title decrease pain 50%   Status Partially Met               Plan - 09/20/15 0914    Clinical Impression Statement Doing much better, less tightness in the lumbar area.   PT Next Visit Plan he will be on vacation next week.  If he returns to PT after that will slowly try to add exercises but avoid hip extension   Consulted and Agree with Plan of Care Patient      Patient will benefit from skilled therapeutic intervention in order to improve the following deficits and impairments:  Decreased mobility, Decreased strength, Decreased  range of motion, Difficulty walking, Impaired flexibility, Pain, Improper body mechanics, Postural dysfunction  Visit Diagnosis: Left-sided low back pain with left-sided sciatica  Difficulty in walking, not elsewhere classified     Problem List There are no active problems to display for this patient.   Sumner Boast., PT 09/20/2015, 9:16 AM  [place], [place], [address] Phone: [phone]   Fax:  [phone]  Name: Dominic Johnson MRN: [id number] Date of Birth: 1956/02/06

## 2015-09-28 ENCOUNTER — Ambulatory Visit: Payer: 59 | Admitting: Physical Therapy

## 2015-10-02 ENCOUNTER — Ambulatory Visit: Payer: 59 | Admitting: Physical Therapy

## 2015-10-02 DIAGNOSIS — R262 Difficulty in walking, not elsewhere classified: Secondary | ICD-10-CM

## 2015-10-02 DIAGNOSIS — M5442 Lumbago with sciatica, left side: Secondary | ICD-10-CM

## 2015-10-02 NOTE — Therapy (Signed)
[place], [place], [address] Phone: [phone]   Fax:  [phone]  Physical Therapy Treatment  Patient Details  Name: Dominic Johnson MRN: [id number] Date of Birth: 1955-12-06 Referring Provider: Berle Mull  Encounter Date: 10/02/2015      PT End of Session - 10/02/15 0911    Visit Number 13   Date for PT Re-Evaluation 10/11/15   PT Start Time 0839   PT Stop Time 0933   PT Time Calculation (min) 54 min   Activity Tolerance Patient tolerated treatment well   Behavior During Therapy [facility] for tasks assessed/performed      Past Medical History  Diagnosis Date  . Diabetes mellitus without complication ([place])   . Hypertension     Past Surgical History  Procedure Laterality Date  . Hernia repair    . Foot surgery      There were no vitals filed for this visit.      Subjective Assessment - 10/02/15 0908    Subjective Patient was on vacation the last week, he went to [facility] and did a lot of walking, reports that he feels very stiff and tight with a little more pain   Currently in Pain? Yes   Pain Score 4    Pain Location Back   Aggravating Factors  standing and walking, kicking exercises   Pain Relieving Factors stretching                         [facility] Adult PT Treatment/Exercise - 10/02/15 0001    Lumbar Exercises: Stretches   Passive Hamstring Stretch 30 seconds;4 reps   ITB Stretch 2 reps;20 seconds   Piriformis Stretch 4 reps;30 seconds   Lumbar Exercises: Aerobic   Tread Mill NuStep Level 5 x 7 minutes   Lumbar Exercises: Machines for Strengthening   Other Lumbar Machine Exercise 35# seated row, and lats 2x15 each   Moist Heat Therapy   Number Minutes Moist Heat 15 Minutes   Moist Heat Location Lumbar Spine   Electrical Stimulation   Electrical Stimulation Location left lumbar/buttock   Electrical Stimulation Action IFC   Electrical Stimulation Parameters  supine   Electrical Stimulation Goals Pain   Traction   Type of Traction Lumbar   Max (lbs) 95   Hold Time Static   Time 15                  PT Short Term Goals - 09/05/15 0911    PT SHORT TERM GOAL #1   Title independent with initial HEP   Status Achieved           PT Long Term Goals - 10/02/15 0912    PT LONG TERM GOAL #1   Title understand proper posture and body mechanics   Status Achieved   PT LONG TERM GOAL #2   Title decrease pain 50%   Status Partially Met   PT LONG TERM GOAL #4   Title tolerate walking 500 feet   Status Achieved               Plan - 10/02/15 0911    Clinical Impression Statement HS and piriformis are much tighter today after being gone a week.  Tried to restart some exercises   PT Next Visit Plan we will try to add the exercises again but will need to avoid the hip extension exercises   Consulted and Agree with  Plan of Care Patient      Patient will benefit from skilled therapeutic intervention in order to improve the following deficits and impairments:  Decreased mobility, Decreased strength, Decreased range of motion, Difficulty walking, Impaired flexibility, Pain, Improper body mechanics, Postural dysfunction  Visit Diagnosis: Difficulty in walking, not elsewhere classified  Left-sided low back pain with left-sided sciatica     Problem List There are no active problems to display for this patient.   Sumner Boast., PT 10/02/2015, 9:13 AM  [place], [place], [address] Phone: [phone]   Fax:  [phone]  Name: Dominic Johnson MRN: [id number] Date of Birth: [birth year]

## 2015-10-04 ENCOUNTER — Encounter: Payer: Self-pay | Admitting: Physical Therapy

## 2015-10-04 ENCOUNTER — Ambulatory Visit: Payer: 59 | Admitting: Physical Therapy

## 2015-10-04 DIAGNOSIS — M5442 Lumbago with sciatica, left side: Secondary | ICD-10-CM

## 2015-10-04 DIAGNOSIS — R262 Difficulty in walking, not elsewhere classified: Secondary | ICD-10-CM

## 2015-10-04 NOTE — Therapy (Signed)
[place], [place], [address] Phone: [phone]   Fax:  [phone]  Physical Therapy Treatment  Patient Details  Name: Dominic Johnson MRN: [id number] Date of Birth: Apr 26, 1955 Referring Provider: Berle Mull  Encounter Date: 10/04/2015      PT End of Session - 10/04/15 1006    Visit Number 14   Date for PT Re-Evaluation 10/11/15   PT Start Time 0924   PT Stop Time 1020   PT Time Calculation (min) 56 min   Activity Tolerance Patient tolerated treatment well   Behavior During Therapy [facility] for tasks assessed/performed      Past Medical History  Diagnosis Date  . Diabetes mellitus without complication ([place])   . Hypertension     Past Surgical History  Procedure Laterality Date  . Hernia repair    . Foot surgery      There were no vitals filed for this visit.      Subjective Assessment - 10/04/15 0924    Subjective Reports he was a little sore after the last visit, but thinks that he should continue to get more strength   Currently in Pain? Yes   Pain Score 3    Pain Location Back   Pain Orientation Right;Lower   Pain Descriptors / Indicators Sore   Pain Type Chronic pain                         [facility] Adult PT Treatment/Exercise - 10/04/15 0001    Lumbar Exercises: Stretches   Passive Hamstring Stretch 30 seconds;4 reps   ITB Stretch 2 reps;20 seconds   Piriformis Stretch 4 reps;30 seconds   Lumbar Exercises: Aerobic   Tread Mill NuStep Level 5 x 7 minutes   Lumbar Exercises: Machines for Strengthening   Other Lumbar Machine Exercise 35# straight arm pulldowns   Other Lumbar Machine Exercise 35# seated row, and lats 2x15 each   Moist Heat Therapy   Number Minutes Moist Heat 15 Minutes   Moist Heat Location Lumbar Spine   Electrical Stimulation   Electrical Stimulation Location left lumbar/buttock   Electrical Stimulation Action IFC   Electrical  Stimulation Parameters supine   Electrical Stimulation Goals Pain   Traction   Type of Traction Lumbar   Max (lbs) 95   Hold Time Static   Time 15                  PT Short Term Goals - 09/05/15 0911    PT SHORT TERM GOAL #1   Title independent with initial HEP   Status Achieved           PT Long Term Goals - 10/02/15 0912    PT LONG TERM GOAL #1   Title understand proper posture and body mechanics   Status Achieved   PT LONG TERM GOAL #2   Title decrease pain 50%   Status Partially Met   PT LONG TERM GOAL #4   Title tolerate walking 500 feet   Status Achieved               Plan - 10/04/15 1006    Clinical Impression Statement A little better with the tightness of the LE's, he tolerated the added exercises without increase of pain   PT Next Visit Plan slowly add to the exercises      Patient will benefit from skilled therapeutic intervention in order to improve  the following deficits and impairments:  Decreased mobility, Decreased strength, Decreased range of motion, Difficulty walking, Impaired flexibility, Pain, Improper body mechanics, Postural dysfunction  Visit Diagnosis: Difficulty in walking, not elsewhere classified  Left-sided low back pain with left-sided sciatica     Problem List There are no active problems to display for this patient.   Sumner Boast., PT 10/04/2015, 10:08 AM  [place], [place], [address] Phone: [phone]   Fax:  [phone]  Name: Dominic Johnson MRN: [id number] Date of Birth: 1956/01/18

## 2015-10-06 ENCOUNTER — Ambulatory Visit: Payer: 59 | Admitting: Physical Therapy

## 2015-10-09 ENCOUNTER — Ambulatory Visit: Payer: 59 | Admitting: Physical Therapy

## 2015-10-09 ENCOUNTER — Encounter: Payer: Self-pay | Admitting: Physical Therapy

## 2015-10-09 DIAGNOSIS — R262 Difficulty in walking, not elsewhere classified: Secondary | ICD-10-CM

## 2015-10-09 DIAGNOSIS — M5442 Lumbago with sciatica, left side: Secondary | ICD-10-CM

## 2015-10-09 NOTE — Therapy (Signed)
[place], [place], [address] Phone: [phone]   Fax:  [phone]  Physical Therapy Treatment  Patient Details  Name: Dominic Johnson MRN: [id number] Date of Birth: 24-Nov-1955 Referring Provider: Berle Mull  Encounter Date: 10/09/2015      PT End of Session - 10/09/15 1052    Visit Number 15   Date for PT Re-Evaluation 10/11/15   PT Start Time 0925   PT Stop Time 1020   PT Time Calculation (min) 55 min   Activity Tolerance Patient tolerated treatment well   Behavior During Therapy [facility] for tasks assessed/performed      Past Medical History  Diagnosis Date  . Diabetes mellitus without complication ([place])   . Hypertension     Past Surgical History  Procedure Laterality Date  . Hernia repair    . Foot surgery      There were no vitals filed for this visit.      Subjective Assessment - 10/09/15 0925    Subjective Felt pretty good doing the exercises again.   Currently in Pain? Yes   Pain Score 2    Pain Location Back   Pain Orientation Right;Lower                         [facility] Adult PT Treatment/Exercise - 10/09/15 0001    Lumbar Exercises: Stretches   Passive Hamstring Stretch 30 seconds;4 reps   ITB Stretch 2 reps;20 seconds   Piriformis Stretch 4 reps;30 seconds   Lumbar Exercises: Aerobic   Tread Mill NuStep Level 5 x 7 minutes   Lumbar Exercises: Machines for Strengthening   Leg Press 40$ 2x15   Other Lumbar Machine Exercise 35# straight arm pulldowns   Other Lumbar Machine Exercise 35# seated row, and lats 2x15 each, 45# chest press   Lumbar Exercises: Supine   Other Supine Lumbar Exercises feet on ball K2C, trunk rotation and small bridges   Moist Heat Therapy   Number Minutes Moist Heat 15 Minutes   Moist Heat Location Lumbar Spine   Electrical Stimulation   Electrical Stimulation Location left lumbar/buttock   Electrical Stimulation Action IFC    Electrical Stimulation Parameters supine   Electrical Stimulation Goals Pain                  PT Short Term Goals - 09/05/15 0911    PT SHORT TERM GOAL #1   Title independent with initial HEP   Status Achieved           PT Long Term Goals - 10/02/15 0912    PT LONG TERM GOAL #1   Title understand proper posture and body mechanics   Status Achieved   PT LONG TERM GOAL #2   Title decrease pain 50%   Status Partially Met   PT LONG TERM GOAL #4   Title tolerate walking 500 feet   Status Achieved               Plan - 10/09/15 1053    Clinical Impression Statement Patient wanted to try without traction today.  Tolerating increased activities without increase of pain   PT Next Visit Plan continue to add exercises   Consulted and Agree with Plan of Care Patient      Patient will benefit from skilled therapeutic intervention in order to improve the following deficits and impairments:  Decreased mobility, Decreased strength, Decreased range of motion,  Difficulty walking, Impaired flexibility, Pain, Improper body mechanics, Postural dysfunction  Visit Diagnosis: Difficulty in walking, not elsewhere classified  Left-sided low back pain with left-sided sciatica     Problem List There are no active problems to display for this patient.   Sumner Boast., PT 10/09/2015, 10:54 AM  [place], [place], [address] Phone: [phone]   Fax:  [phone]  Name: Dominic Johnson MRN: [id number] Date of Birth: Jul 11, 1955

## 2015-10-11 ENCOUNTER — Ambulatory Visit: Payer: 59 | Admitting: Physical Therapy

## 2015-10-23 ENCOUNTER — Ambulatory Visit: Payer: 59 | Attending: Sports Medicine | Admitting: Physical Therapy

## 2015-10-23 ENCOUNTER — Encounter: Payer: Self-pay | Admitting: Physical Therapy

## 2015-10-23 DIAGNOSIS — M5442 Lumbago with sciatica, left side: Secondary | ICD-10-CM | POA: Diagnosis present

## 2015-10-23 DIAGNOSIS — R262 Difficulty in walking, not elsewhere classified: Secondary | ICD-10-CM | POA: Diagnosis present

## 2015-10-23 NOTE — Therapy (Signed)
[address] [place], [place], [address] Phone: [phone]   Fax:  [phone]  Physical Therapy Treatment  Patient Details  Name: Dominic Johnson MRN: [id number] Date of Birth: [birth year] Referring Provider: Berle Mull  Encounter Date: 10/23/2015      PT End of Session - 10/23/15 0918    Visit Number 16   Date for PT Re-Evaluation 11/10/15   PT Start Time 0838   PT Stop Time 0933   PT Time Calculation (min) 55 min   Activity Tolerance Patient tolerated treatment well   Behavior During Therapy [facility] for tasks assessed/performed      Past Medical History  Diagnosis Date  . Diabetes mellitus without complication ([place])   . Hypertension     Past Surgical History  Procedure Laterality Date  . Hernia repair    . Foot surgery      There were no vitals filed for this visit.      Subjective Assessment - 10/23/15 0843    Subjective If I stand still for awhile I get this tightness and pain, if I am moving I feel pretty good   Currently in Pain? Yes   Pain Score 1    Pain Location Back   Pain Orientation Right;Lower   Pain Descriptors / Indicators Aching;Sore;Tightness   Pain Type Chronic pain                         [facility] Adult PT Treatment/Exercise - 10/23/15 0001    Lumbar Exercises: Aerobic   Tread Mill NuStep Level 5 x 7 minutes   Lumbar Exercises: Machines for Strengthening   Leg Press 40# 2x15   Other Lumbar Machine Exercise 35# straight arm pulldowns   Other Lumbar Machine Exercise 45# seated row, and lats 2x15 each, 45# chest press   Lumbar Exercises: Supine   Other Supine Lumbar Exercises overhead carry with 10#   Moist Heat Therapy   Number Minutes Moist Heat 15 Minutes   Moist Heat Location Lumbar Spine   Electrical Stimulation   Electrical Stimulation Location left lumbar/buttock   Electrical Stimulation Action IFC   Electrical Stimulation Parameters supine   Electrical Stimulation Goals Pain                  PT Short Term Goals - 09/05/15 0911    PT SHORT TERM GOAL #1   Title independent with initial HEP   Status Achieved           PT Long Term Goals - 10/23/15 0920    PT LONG TERM GOAL #1   Title understand proper posture and body mechanics   Status Achieved   PT LONG TERM GOAL #2   Title decrease pain 50%   Status Partially Met   PT LONG TERM GOAL #3   Title increase lumbar ROM 25%   Status Achieved   PT LONG TERM GOAL #4   Title tolerate walking 500 feet   Status Achieved               Plan - 10/23/15 0919    Clinical Impression Statement Patient still with tightness in the LE's.  He is tolerating some increase in exercises.  His only issue is stationary standing   PT Next Visit Plan add flexibility for his home program   Consulted and Agree with Plan of Care Patient      Patient will benefit from skilled  therapeutic intervention in order to improve the following deficits and impairments:  Decreased mobility, Decreased strength, Decreased range of motion, Difficulty walking, Impaired flexibility, Pain, Improper body mechanics, Postural dysfunction  Visit Diagnosis: Difficulty in walking, not elsewhere classified - Plan: PT plan of care cert/re-cert  Left-sided low back pain with left-sided sciatica - Plan: PT plan of care cert/re-cert     Problem List There are no active problems to display for this patient.   Sumner Boast., PT 10/23/2015, 9:22 AM  [place] [address] [place], [place], [address] Phone: [phone]   Fax:  [phone]  Name: Dominic Johnson MRN: [id number] Date of Birth: [birth year]

## 2015-10-25 ENCOUNTER — Ambulatory Visit: Payer: 59 | Admitting: Physical Therapy

## 2015-10-30 ENCOUNTER — Encounter: Payer: Self-pay | Admitting: Physical Therapy

## 2015-10-30 ENCOUNTER — Ambulatory Visit: Payer: 59 | Admitting: Physical Therapy

## 2015-10-30 DIAGNOSIS — R262 Difficulty in walking, not elsewhere classified: Secondary | ICD-10-CM

## 2015-10-30 DIAGNOSIS — M5442 Lumbago with sciatica, left side: Secondary | ICD-10-CM

## 2015-10-30 NOTE — Therapy (Addendum)
[address] [place], [place], [address] Phone: [phone]   Fax:  [phone]  Physical Therapy Treatment  Patient Details  Name: Dominic Johnson MRN: [id number] Date of Birth: Apr 19, 1955 Referring Provider: Berle Mull  Encounter Date: 10/30/2015      PT End of Session - 10/30/15 0917    Visit Number 17   Date for PT Re-Evaluation 11/10/15   PT Start Time 0840   PT Stop Time 0930   PT Time Calculation (min) 50 min   Activity Tolerance Patient limited by pain   Behavior During Therapy [facility] for tasks assessed/performed      Past Medical History  Diagnosis Date  . Diabetes mellitus without complication ([place])   . Hypertension     Past Surgical History  Procedure Laterality Date  . Hernia repair    . Foot surgery      There were no vitals filed for this visit.      Subjective Assessment - 10/30/15 0838    Subjective A little sore on the right lower back   Currently in Pain? Yes   Pain Score 2    Pain Location Back   Pain Orientation Right;Lower   Aggravating Factors  standing   Pain Relieving Factors stretches                         [facility] Adult PT Treatment/Exercise - 10/30/15 0001    Lumbar Exercises: Stretches   Passive Hamstring Stretch 30 seconds;4 reps   Piriformis Stretch 4 reps;30 seconds   Lumbar Exercises: Aerobic   Tread Mill NuStep Level 5 x 7 minutes   Lumbar Exercises: Machines for Strengthening   Leg Press 40# 2x15   Other Lumbar Machine Exercise 35# straight arm pulldowns   Other Lumbar Machine Exercise 45# seated row, and lats 2x15 each, 45# chest press   Moist Heat Therapy   Number Minutes Moist Heat 15 Minutes   Moist Heat Location Lumbar Spine   Electrical Stimulation   Electrical Stimulation Location left lumbar/buttock   Electrical Stimulation Action IFC   Electrical Stimulation Parameters supine   Electrical Stimulation Goals Pain   Manual  Therapy   Manual therapy comments STM to the right SI area                  PT Short Term Goals - 09/05/15 0911    PT SHORT TERM GOAL #1   Title independent with initial HEP   Status Achieved           PT Long Term Goals - 10/23/15 0920    PT LONG TERM GOAL #1   Title understand proper posture and body mechanics   Status Achieved   PT LONG TERM GOAL #2   Title decrease pain 50%   Status Partially Met   PT LONG TERM GOAL #3   Title increase lumbar ROM 25%   Status Achieved   PT LONG TERM GOAL #4   Title tolerate walking 500 feet   Status Achieved               Plan - 10/30/15 0918    Clinical Impression Statement Patient continues to have tight LE but with stretching he has much better ROM and decreased pain.  Working on him doing more of this at home   PT Next Visit Plan add flexibility for his home program   Consulted and Agree with Plan  of Care Patient      Patient will benefit from skilled therapeutic intervention in order to improve the following deficits and impairments:  Decreased mobility, Decreased strength, Decreased range of motion, Difficulty walking, Impaired flexibility, Pain, Improper body mechanics, Postural dysfunction  Visit Diagnosis: Difficulty in walking, not elsewhere classified  Left-sided low back pain with left-sided sciatica  PHYSICAL THERAPY DISCHARGE SUMMARY   Plan: Patient agrees to discharge.  Patient goals were met. Patient is being discharged due to meeting the stated rehab goals.  ?????        Problem List There are no active problems to display for this patient.   Sumner Boast., PT 10/30/2015, 9:19 AM  [address] [place], [place], [address] Phone: [phone]   Fax:  [phone]  Name: KELCY BAETEN MRN: [id number] Date of Birth: 1956/02/05

## 2015-10-31 ENCOUNTER — Telehealth: Payer: Self-pay

## 2015-10-31 NOTE — Telephone Encounter (Signed)
Patient cxl PT appt 11/01/15 - out of town for approx 10 days, will call back to reschedule

## 2015-11-01 ENCOUNTER — Ambulatory Visit: Payer: 59 | Admitting: Physical Therapy

## 2017-02-25 IMAGING — US US EXTREM LOW VENOUS*R*
1 series · 13 of 24 positions shown · non-contrast
Comparison: None.

CLINICAL DATA: Acute onset of right lower extremity pain after long
flight, with erythema and swelling. Initial encounter.



[Series 1: us extrem low venous*right* · 0.11mm/px · 29 acquisitions, 13 frames shown]
[im 1/29]
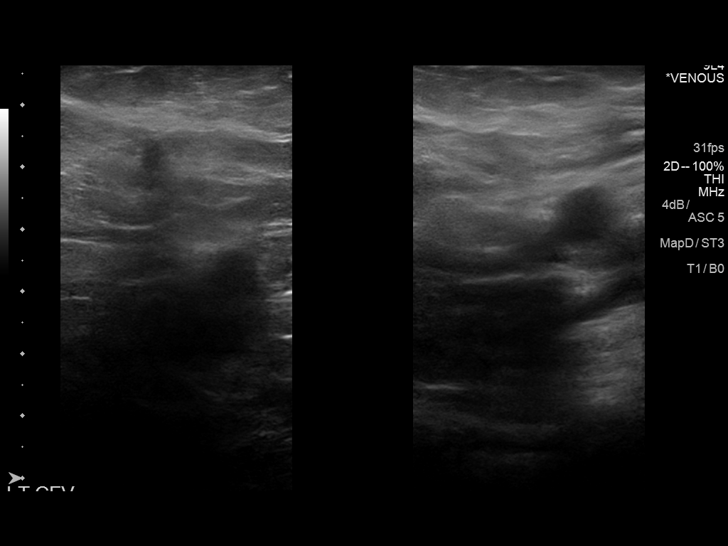
[im 3/29]
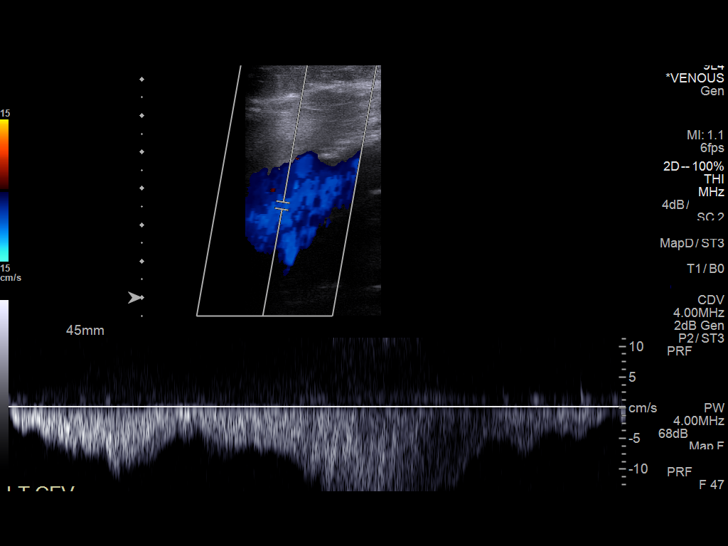
[im 5/29]
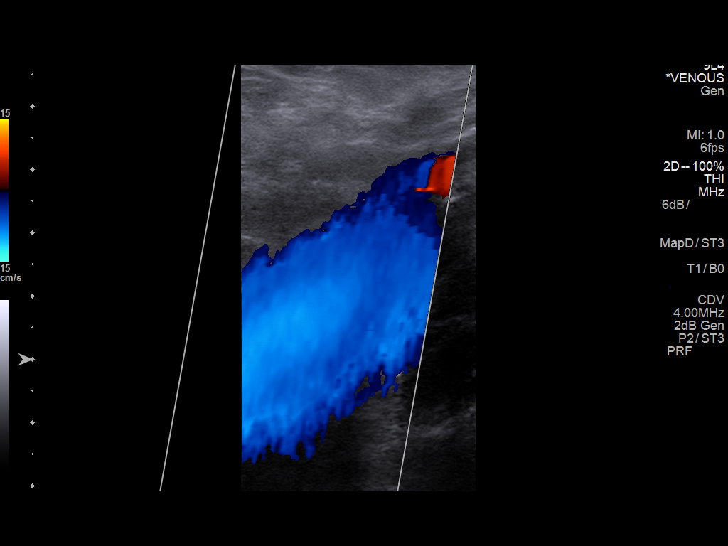
[im 8/29]
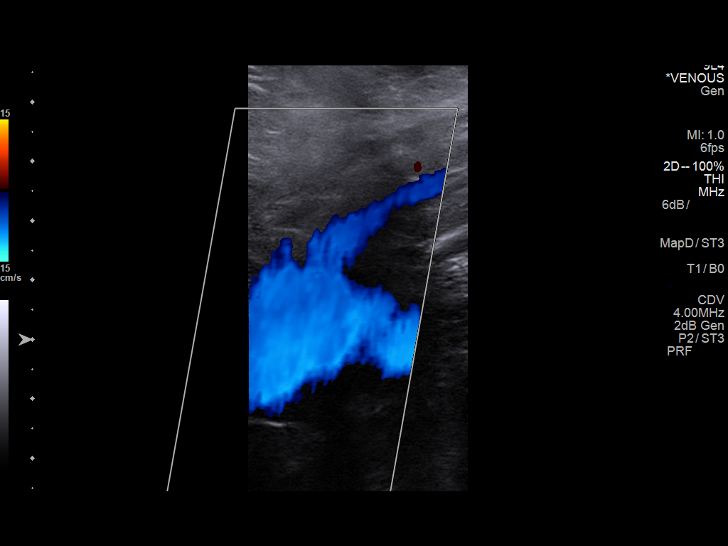
[im 10/29]
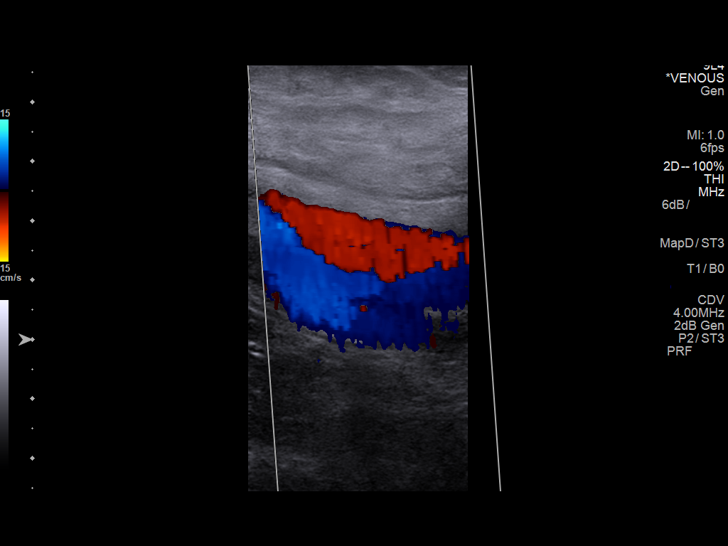
[im 13/29]
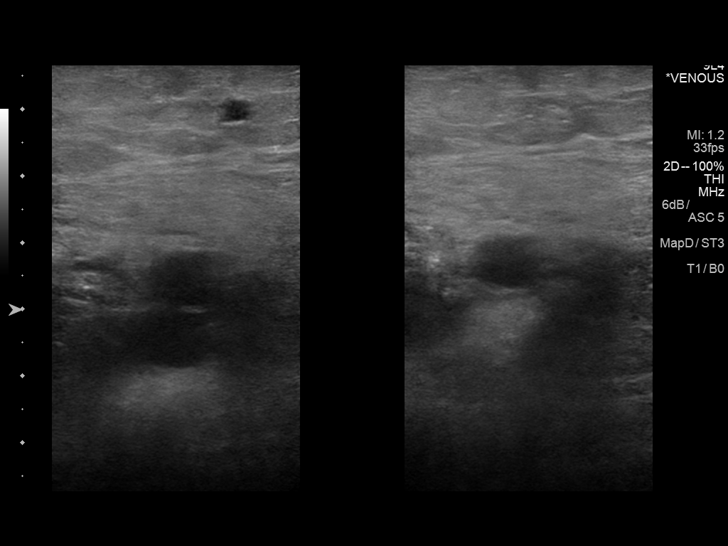
[im 16/29]
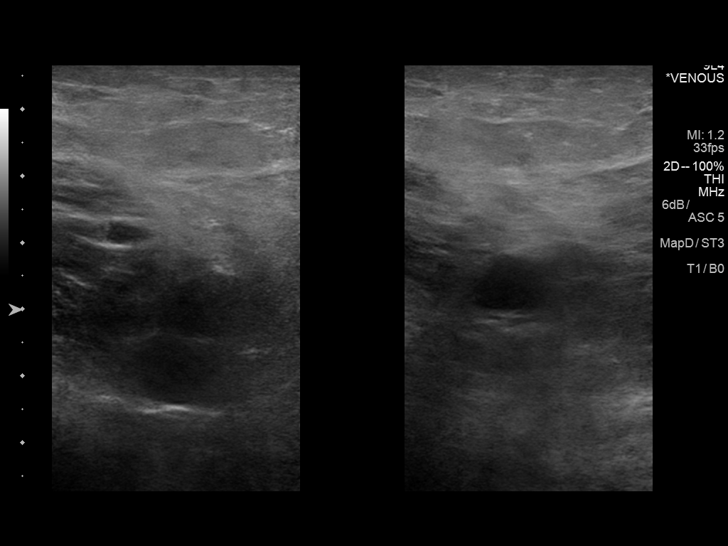
[im 18/29]
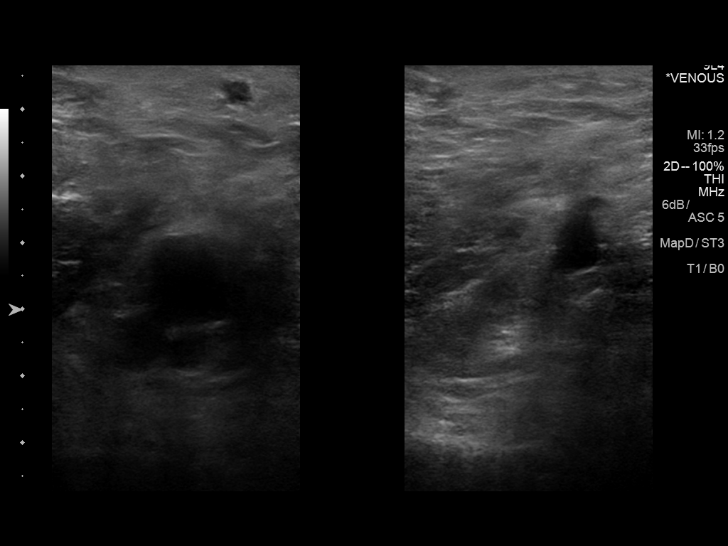
[im 20/29]
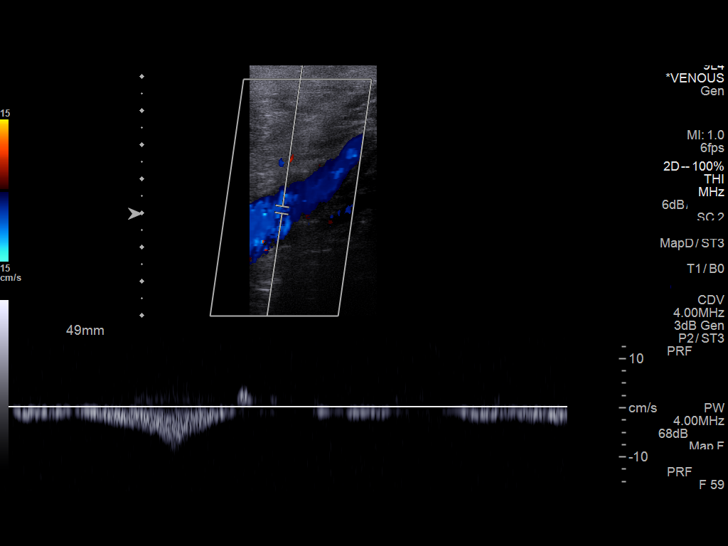
[im 22/29]
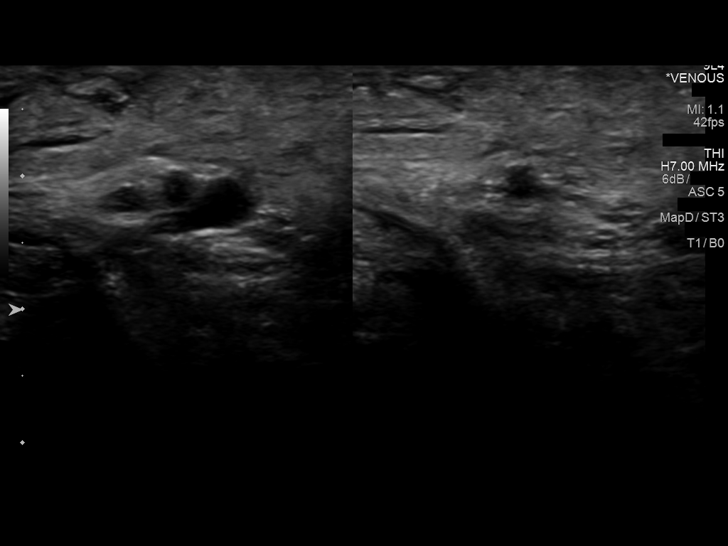
[im 24/29]
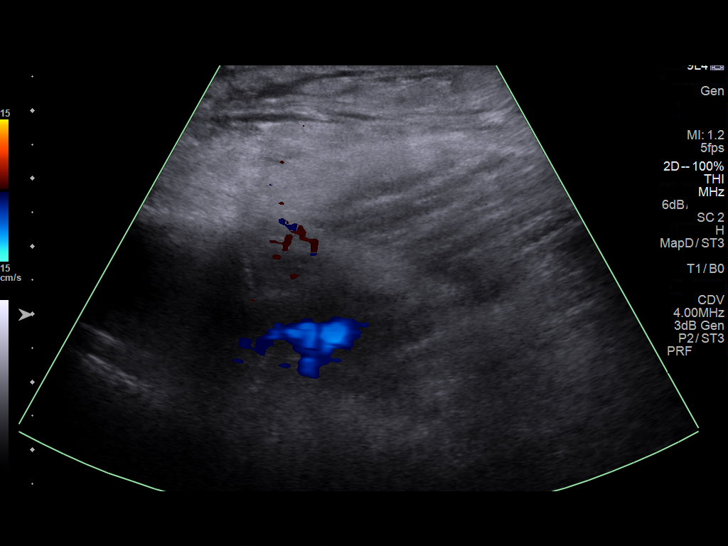
[im 26/29]
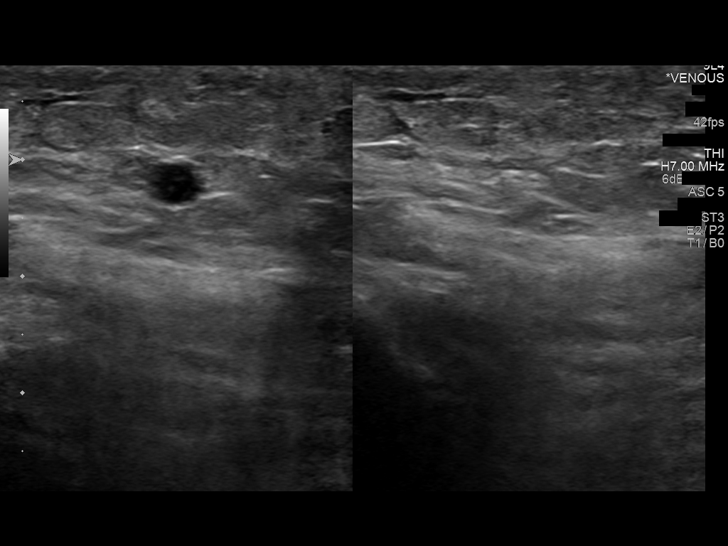
[im 29/29]
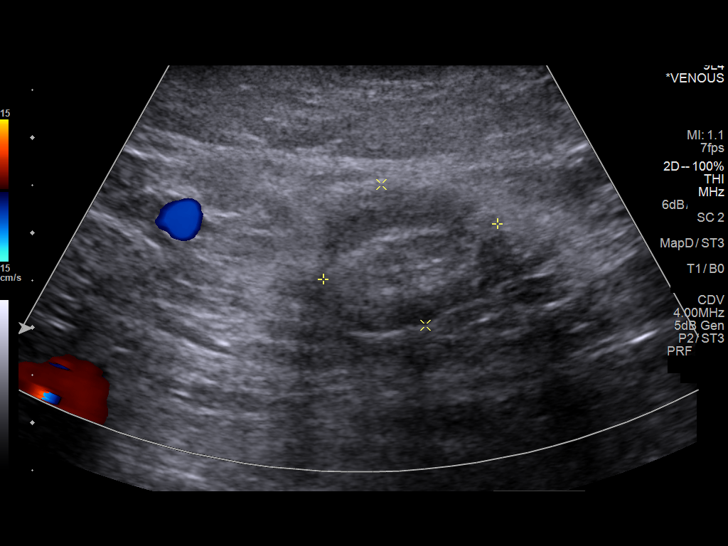

[13 of 24 positions shown; findings below may reference images not displayed]

FINDINGS: Contralateral Common Femoral Vein: Respiratory phasicity is normal
and symmetric with the symptomatic side. No evidence of thrombus.
Normal compressibility.

Common Femoral Vein: No evidence of thrombus. Normal
compressibility, respiratory phasicity and response to augmentation.

Saphenofemoral Junction: No evidence of thrombus. Normal
compressibility and flow on color Doppler imaging.

Profunda Femoral Vein: No evidence of thrombus. Normal
compressibility and flow on color Doppler imaging.

Femoral Vein: No evidence of thrombus. Normal compressibility,
respiratory phasicity and response to augmentation.

Popliteal Vein: No evidence of thrombus. Normal compressibility,
respiratory phasicity and response to augmentation.

Calf Veins: No evidence of thrombus. Normal compressibility and flow
on color Doppler imaging.

Superficial Great Saphenous Vein: No evidence of thrombus. Normal
compressibility and flow on color Doppler imaging.

Venous Reflux:  None.

Other Findings: There is diffuse edema and erythema involving the
right calf, compatible with cellulitis. A prominent right inguinal
node is seen, measuring 1.6 cm in short axis.
IMPRESSION: 1. No evidence of deep venous thrombosis.
2. Diffuse edema and erythema involving the right calf, compatible
with cellulitis. Prominent right inguinal node noted.

## 2018-05-10 ENCOUNTER — Emergency Department (HOSPITAL_BASED_OUTPATIENT_CLINIC_OR_DEPARTMENT_OTHER)
Admission: EM | Admit: 2018-05-10 | Discharge: 2018-05-10 | Disposition: A | Discharge status: Home / Self Care | Payer: 59 | Attending: Emergency Medicine | Admitting: Emergency Medicine

## 2018-05-10 ENCOUNTER — Other Ambulatory Visit: Payer: Self-pay

## 2018-05-10 ENCOUNTER — Encounter (HOSPITAL_BASED_OUTPATIENT_CLINIC_OR_DEPARTMENT_OTHER): Payer: Self-pay | Admitting: Emergency Medicine

## 2018-05-10 DIAGNOSIS — S91331A Puncture wound without foreign body, right foot, initial encounter: Secondary | ICD-10-CM | POA: Insufficient documentation

## 2018-05-10 DIAGNOSIS — Y999 Unspecified external cause status: Secondary | ICD-10-CM | POA: Diagnosis not present

## 2018-05-10 DIAGNOSIS — Z7984 Long term (current) use of oral hypoglycemic drugs: Secondary | ICD-10-CM | POA: Diagnosis not present

## 2018-05-10 DIAGNOSIS — Y9301 Activity, walking, marching and hiking: Secondary | ICD-10-CM | POA: Diagnosis not present

## 2018-05-10 DIAGNOSIS — W260XXA Contact with knife, initial encounter: Secondary | ICD-10-CM | POA: Diagnosis not present

## 2018-05-10 DIAGNOSIS — E119 Type 2 diabetes mellitus without complications: Secondary | ICD-10-CM | POA: Insufficient documentation

## 2018-05-10 DIAGNOSIS — Y929 Unspecified place or not applicable: Secondary | ICD-10-CM | POA: Insufficient documentation

## 2018-05-10 DIAGNOSIS — Z23 Encounter for immunization: Secondary | ICD-10-CM | POA: Diagnosis not present

## 2018-05-10 DIAGNOSIS — I10 Essential (primary) hypertension: Secondary | ICD-10-CM | POA: Insufficient documentation

## 2018-05-10 DIAGNOSIS — T148XXA Other injury of unspecified body region, initial encounter: Secondary | ICD-10-CM

## 2018-05-10 MED ORDER — TETANUS-DIPHTH-ACELL PERTUSSIS 5-2.5-18.5 LF-MCG/0.5 IM SUSP
0.5000 mL | Freq: Once | INTRAMUSCULAR | Status: AC
  Administered 2018-05-10: 0.5 mL via INTRAMUSCULAR
  Filled 2018-05-10: qty 0.5

## 2018-05-10 MED ORDER — CIPROFLOXACIN HCL 500 MG PO TABS: 500.0000 mg | ORAL_TABLET | Freq: Two times a day (BID) | ORAL | 0 refills | Status: AC

## 2018-05-10 NOTE — Discharge Instructions (Signed)
Take the prescribed medication as directed.  Keep an eye on wound-- monitor for redness, swelling, drainage, etc. Follow-up with your primary care doctor. Return to the ED for new or worsening symptoms.

## 2018-05-10 NOTE — ED Triage Notes (Signed)
Pt here after stepping on a razor blade. Bleeding is controlled in triage.

## 2018-05-10 NOTE — ED Notes (Signed)
ED Provider at bedside. 

## 2018-05-10 NOTE — ED Provider Notes (Signed)
[facility] EMERGENCY DEPARTMENT Provider Note   CSN: [id number] Arrival date & time: 05/10/18  1631     History   Chief Complaint Chief Complaint  Patient presents with  . Extremity Laceration    HPI Dominic Johnson is a 63 y.o. male.  The history is provided by the patient and medical records.     63 year old male with history of hypertension and diabetes, presenting to the ED with laceration of right foot.  States he stepped on a blade of an [employer] knife blade was new and clean.  Estimates it went into the foot approximately 1 inch.  States he has not had any pain with this but did have some bleeding which has stopped at this time.  Denies any pain with the foot, numbness, or tingling.  He is diabetic but has not had any issues with foot infections or nonhealing wounds in the past.  Unsure of last tetanus.  Past Medical History:  Diagnosis Date  . Diabetes mellitus without complication ([facility])   . Hypertension     There are no active problems to display for this patient.   Past Surgical History:  Procedure Laterality Date  . foot surgery    . HERNIA REPAIR          Home Medications    Prior to Admission medications   Medication Sig Start Date End Date Taking? Authorizing Provider  doxycycline (VIBRAMYCIN) 100 MG capsule Take 1 capsule (100 mg total) by mouth 2 (two) times daily. 11/14/14  Yes Kirichenko, Tatyana, PA-C  Dulaglutide (TRULICITY) 1.5 MG/0.5ML SOPN Inject 1.5 mg into the skin once a week.   Yes [provider]  empagliflozin (JARDIANCE) 25 MG TABS tablet Take 25 mg by mouth daily.   Yes [provider]  METFORMIN HCL PO Take 1,000 mg by mouth 2 (two) times daily.    Yes [provider]  Valsartan (DIOVAN PO) Take 25 mg by mouth daily.    Yes [provider]  cyclobenzaprine (FLEXERIL) 5 MG tablet Take 1 tablet (5 mg total) by mouth 2 (two) times daily as needed for muscle spasms. 09/09/13   Teressa Lower,  NP  HYDROcodone-acetaminophen (NORCO) 5-325 MG per tablet Take 1 tablet by mouth every 6 (six) hours as needed for moderate pain. 11/14/14   Kirichenko, Lemont Fillers, PA-C  oxyCODONE-acetaminophen (PERCOCET/ROXICET) 5-325 MG per tablet Take 1-2 tablets by mouth every 8 (eight) hours as needed for moderate pain or severe pain. 09/09/13   Teressa Lower, NP    Family History History reviewed. No pertinent family history.  Social History Social History   Tobacco Use  . Smoking status: Never Smoker  Substance Use Topics  . Alcohol use: No  . Drug use: No     Allergies   Codeine   Review of Systems Review of Systems  Skin: Positive for wound.  All other systems reviewed and are negative.    Physical Exam Updated Vital Signs BP 135/89 (BP Location: Right Arm)   Pulse 93   Temp 97.7 F (36.5 C)   Resp 18   Ht 6\' 3"  (1.905 m)   Wt (!) 145.2 kg   SpO2 100%   BMI 40.00 kg/m   Physical Exam Vitals signs and nursing note reviewed.  Constitutional:      Appearance: He is well-developed.  HENT:     Head: Normocephalic and atraumatic.  Eyes:     Conjunctiva/sclera: Conjunctivae normal.     Pupils: Pupils are equal, round, and  reactive to light.  Neck:     Musculoskeletal: Normal range of motion.  Cardiovascular:     Rate and Rhythm: Normal rate and regular rhythm.     Heart sounds: Normal heart sounds.  Pulmonary:     Effort: Pulmonary effort is normal.     Breath sounds: Normal breath sounds.  Abdominal:     General: Bowel sounds are normal.     Palpations: Abdomen is soft.  Musculoskeletal: Normal range of motion.     Comments: 1cm superficial appearing puncture wound to lateral ball of right foot; there is no active bleeding; skin remains well approximated and otherwise intact; moving toes normally; normal distal sensation/perfusion, no other chronic appearing wounds/sores  Skin:    General: Skin is warm and dry.  Neurological:     Mental Status: He is alert and  oriented to person, place, and time.      ED Treatments / Results  Labs (all labs ordered are listed, but only abnormal results are displayed) Labs Reviewed - No data to display  EKG None  Radiology No results found.  Procedures Procedures (including critical care time)  Medications Ordered in ED Medications  Tdap (BOOSTRIX) injection 0.5 mL (0.5 mLs Intramuscular Given 05/10/18 1957)     Initial Impression / Assessment and Plan / ED Course  I have reviewed the triage vital signs and the nursing notes.  Pertinent labs & imaging results that were available during my care of the patient were reviewed by me and considered in my medical decision making (see chart for details).  63 year old male here with puncture wound to right sole of foot.  On exam, has 1 cm, superficial puncture wound to lateral ball of right foot.  There is no active bleeding or signs of infection.  His foot is neurovascularly intact.  He denies any significant pain.  Tetanus will be updated here.  Wound cleansed and dressed.  Patient has known history of diabetes but no prior history of nonhealing foot ulcers in the past.  In light of this, will treat prophylactically with course of ciprofloxacin for coverage against Pseudomonas.  Encouraged continued home wound care, close monitoring for any signs of infection.  Close follow-up with PCP.  Return here for any new or worsening symptoms.  Final Clinical Impressions(s) / ED Diagnoses   Final diagnoses:  Puncture wound    ED Discharge Orders         Ordered    ciprofloxacin (CIPRO) 500 MG tablet  2 times daily     05/10/18 2007           Oletha BlendSanders, Sandrea Boer M, PA-C 05/10/18 2011    Tegeler, Canary Brimhristopher J, MD 05/11/18 [phone]

## 2021-04-12 ENCOUNTER — Other Ambulatory Visit (HOSPITAL_COMMUNITY): Payer: Self-pay

## 2021-04-13 ENCOUNTER — Other Ambulatory Visit (HOSPITAL_COMMUNITY): Payer: Self-pay

## 2021-04-13 MED ORDER — TRULICITY 4.5 MG/0.5ML ~~LOC~~ SOAJ
4.5000 mg | SUBCUTANEOUS | 10 refills | Status: AC
  Filled 2021-04-13: qty 2, 28d supply, fill #0
  Filled 2021-05-10: qty 2, 28d supply, fill #1
  Filled 2021-09-13: qty 2, 28d supply, fill #2
  Filled 2022-01-15 – 2022-01-17 (×2): qty 2, 28d supply, fill #3

## 2021-05-11 ENCOUNTER — Other Ambulatory Visit (HOSPITAL_COMMUNITY): Payer: Self-pay

## 2021-09-13 ENCOUNTER — Other Ambulatory Visit (HOSPITAL_COMMUNITY): Payer: Self-pay

## 2021-09-14 ENCOUNTER — Other Ambulatory Visit (HOSPITAL_COMMUNITY): Payer: Self-pay

## 2022-01-15 ENCOUNTER — Other Ambulatory Visit (HOSPITAL_COMMUNITY): Payer: Self-pay

## 2022-01-17 ENCOUNTER — Other Ambulatory Visit (HOSPITAL_COMMUNITY): Payer: Self-pay

## 2022-08-02 ENCOUNTER — Other Ambulatory Visit (HOSPITAL_COMMUNITY): Payer: Self-pay

## 2022-08-02 MED ORDER — TRULICITY 4.5 MG/0.5ML ~~LOC~~ SOAJ
4.5000 mg | SUBCUTANEOUS | 1 refills | Status: DC
  Filled 2022-08-02: qty 2, 28d supply, fill #0

## 2022-09-04 ENCOUNTER — Other Ambulatory Visit (HOSPITAL_COMMUNITY): Payer: Self-pay

## 2022-09-11 ENCOUNTER — Other Ambulatory Visit (HOSPITAL_COMMUNITY): Payer: Self-pay

## 2022-09-16 ENCOUNTER — Other Ambulatory Visit (HOSPITAL_COMMUNITY): Payer: Self-pay

## 2022-09-17 ENCOUNTER — Other Ambulatory Visit (HOSPITAL_COMMUNITY): Payer: Self-pay

## 2022-09-17 MED ORDER — TRULICITY 4.5 MG/0.5ML ~~LOC~~ SOAJ
4.5000 mg | SUBCUTANEOUS | 3 refills | Status: DC
  Filled 2022-09-17: qty 2, 28d supply, fill #0

## 2022-09-17 MED ORDER — VALSARTAN-HYDROCHLOROTHIAZIDE 320-12.5 MG PO TABS
1.0000 | ORAL_TABLET | Freq: Every day | ORAL | 0 refills | Status: AC
  Filled 2022-09-17: qty 30, 30d supply, fill #0

## 2022-09-24 ENCOUNTER — Other Ambulatory Visit (HOSPITAL_COMMUNITY): Payer: Self-pay

## 2022-09-25 ENCOUNTER — Other Ambulatory Visit (HOSPITAL_COMMUNITY): Payer: Self-pay

## 2022-09-25 MED ORDER — TRULICITY 3 MG/0.5ML ~~LOC~~ SOAJ
3.0000 mg | SUBCUTANEOUS | 0 refills | Status: DC
  Filled 2022-09-25 – 2022-10-29 (×2): qty 2, 28d supply, fill #0

## 2022-09-26 ENCOUNTER — Other Ambulatory Visit (HOSPITAL_COMMUNITY): Payer: Self-pay

## 2022-10-21 ENCOUNTER — Other Ambulatory Visit (HOSPITAL_COMMUNITY): Payer: Self-pay

## 2022-10-29 ENCOUNTER — Other Ambulatory Visit (HOSPITAL_COMMUNITY): Payer: Self-pay

## 2022-10-29 ENCOUNTER — Other Ambulatory Visit: Payer: Self-pay

## 2022-10-31 ENCOUNTER — Other Ambulatory Visit (HOSPITAL_COMMUNITY): Payer: Self-pay

## 2022-11-25 ENCOUNTER — Other Ambulatory Visit (HOSPITAL_COMMUNITY): Payer: Self-pay

## 2022-11-27 ENCOUNTER — Other Ambulatory Visit (HOSPITAL_COMMUNITY): Payer: Self-pay

## 2022-11-27 MED ORDER — DULAGLUTIDE 3 MG/0.5ML ~~LOC~~ SOAJ
3.0000 mg | SUBCUTANEOUS | 0 refills | Status: DC
  Filled 2022-11-27: qty 2, 28d supply, fill #0

## 2022-11-29 ENCOUNTER — Other Ambulatory Visit (HOSPITAL_COMMUNITY): Payer: Self-pay

## 2022-12-25 ENCOUNTER — Other Ambulatory Visit (HOSPITAL_COMMUNITY): Payer: Self-pay

## 2022-12-27 ENCOUNTER — Other Ambulatory Visit (HOSPITAL_BASED_OUTPATIENT_CLINIC_OR_DEPARTMENT_OTHER): Payer: Self-pay

## 2022-12-27 ENCOUNTER — Other Ambulatory Visit (HOSPITAL_COMMUNITY): Payer: Self-pay

## 2022-12-27 MED ORDER — DULAGLUTIDE 3 MG/0.5ML ~~LOC~~ SOAJ
3.0000 mg | SUBCUTANEOUS | 0 refills | Status: DC
  Filled 2022-12-27: qty 2, 28d supply, fill #0

## 2023-02-06 ENCOUNTER — Other Ambulatory Visit (HOSPITAL_COMMUNITY): Payer: Self-pay

## 2023-02-06 MED ORDER — DULAGLUTIDE 3 MG/0.5ML ~~LOC~~ SOAJ
3.0000 mg | SUBCUTANEOUS | 0 refills | Status: DC
  Filled 2023-02-06: qty 2, 28d supply, fill #0

## 2023-02-17 ENCOUNTER — Other Ambulatory Visit (HOSPITAL_COMMUNITY): Payer: Self-pay

## 2023-03-12 ENCOUNTER — Other Ambulatory Visit (HOSPITAL_COMMUNITY): Payer: Self-pay

## 2023-03-12 MED ORDER — DULAGLUTIDE 3 MG/0.5ML ~~LOC~~ SOAJ
3.0000 mg | SUBCUTANEOUS | 0 refills | Status: DC
  Filled 2023-03-12: qty 2, 28d supply, fill #0

## 2023-04-07 ENCOUNTER — Other Ambulatory Visit (HOSPITAL_COMMUNITY): Payer: Self-pay

## 2023-04-07 MED ORDER — DULAGLUTIDE 3 MG/0.5ML ~~LOC~~ SOAJ
3.0000 mg | SUBCUTANEOUS | 0 refills | Status: DC
  Filled 2023-04-07: qty 2, 28d supply, fill #0

## 2023-05-07 ENCOUNTER — Other Ambulatory Visit (HOSPITAL_COMMUNITY): Payer: Self-pay

## 2023-05-07 MED ORDER — DULAGLUTIDE 3 MG/0.5ML ~~LOC~~ SOAJ
3.0000 mg | SUBCUTANEOUS | 3 refills | Status: DC
  Filled 2023-05-07: qty 2, 28d supply, fill #0
  Filled 2023-06-09: qty 2, 28d supply, fill #1
  Filled 2023-07-09: qty 2, 28d supply, fill #2

## 2023-05-09 ENCOUNTER — Other Ambulatory Visit (HOSPITAL_COMMUNITY): Payer: Self-pay

## 2023-06-13 ENCOUNTER — Other Ambulatory Visit (HOSPITAL_COMMUNITY): Payer: Self-pay
